# Patient Record
Sex: Female | Born: 1980 | Race: Black or African American | Hispanic: No | Marital: Single | Smoking: Never smoker
Health system: Southern US, Community
[De-identification: ages and names within clinical notes are randomized; demographics above are authoritative.]

## PROBLEM LIST (undated history)

## (undated) DIAGNOSIS — K219 Gastro-esophageal reflux disease without esophagitis: Secondary | ICD-10-CM

## (undated) DIAGNOSIS — B958 Unspecified staphylococcus as the cause of diseases classified elsewhere: Secondary | ICD-10-CM

## (undated) DIAGNOSIS — E041 Nontoxic single thyroid nodule: Secondary | ICD-10-CM

## (undated) DIAGNOSIS — R05 Cough: Secondary | ICD-10-CM

## (undated) DIAGNOSIS — G43909 Migraine, unspecified, not intractable, without status migrainosus: Secondary | ICD-10-CM

## (undated) DIAGNOSIS — R1310 Dysphagia, unspecified: Secondary | ICD-10-CM

## (undated) DIAGNOSIS — M797 Fibromyalgia: Secondary | ICD-10-CM

## (undated) DIAGNOSIS — R059 Cough, unspecified: Secondary | ICD-10-CM

## (undated) HISTORY — DX: Migraine, unspecified, not intractable, without status migrainosus: G43.909

## (undated) HISTORY — DX: Cough, unspecified: R05.9

## (undated) HISTORY — DX: Dysphagia, unspecified: R13.10

## (undated) HISTORY — PX: OTHER SURGICAL HISTORY: SHX169

## (undated) HISTORY — DX: Cough: R05

## (undated) HISTORY — DX: Gastro-esophageal reflux disease without esophagitis: K21.9

---
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
## 1999-01-04 ENCOUNTER — Inpatient Hospital Stay (HOSPITAL_COMMUNITY): Admission: AD | Admit: 1999-01-04 | Discharge: 1999-01-04 | Payer: Self-pay | Admitting: *Deleted

## 1999-03-29 ENCOUNTER — Inpatient Hospital Stay (HOSPITAL_COMMUNITY): Admission: AD | Admit: 1999-03-29 | Discharge: 1999-03-29 | Payer: Self-pay | Admitting: *Deleted

## 1999-05-23 ENCOUNTER — Encounter: Payer: Self-pay | Admitting: Emergency Medicine

## 1999-05-23 ENCOUNTER — Emergency Department (HOSPITAL_COMMUNITY): Admission: EM | Admit: 1999-05-23 | Discharge: 1999-05-23 | Payer: Self-pay | Admitting: Emergency Medicine

## 1999-05-29 ENCOUNTER — Emergency Department (HOSPITAL_COMMUNITY): Admission: EM | Admit: 1999-05-29 | Discharge: 1999-05-29 | Payer: Self-pay | Admitting: *Deleted

## 1999-06-21 ENCOUNTER — Inpatient Hospital Stay (HOSPITAL_COMMUNITY): Admission: AD | Admit: 1999-06-21 | Discharge: 1999-06-21 | Payer: Self-pay | Admitting: *Deleted

## 1999-08-22 ENCOUNTER — Emergency Department (HOSPITAL_COMMUNITY): Admission: EM | Admit: 1999-08-22 | Discharge: 1999-08-22 | Payer: Self-pay | Admitting: *Deleted

## 1999-09-13 ENCOUNTER — Inpatient Hospital Stay (HOSPITAL_COMMUNITY): Admission: AD | Admit: 1999-09-13 | Discharge: 1999-09-13 | Payer: Self-pay | Admitting: *Deleted

## 1999-12-06 ENCOUNTER — Inpatient Hospital Stay (HOSPITAL_COMMUNITY): Admission: AD | Admit: 1999-12-06 | Discharge: 1999-12-06 | Payer: Self-pay | Admitting: *Deleted

## 2000-02-29 ENCOUNTER — Inpatient Hospital Stay (HOSPITAL_COMMUNITY): Admission: AD | Admit: 2000-02-29 | Discharge: 2000-02-29 | Payer: Self-pay | Admitting: *Deleted

## 2000-05-30 ENCOUNTER — Inpatient Hospital Stay (HOSPITAL_COMMUNITY): Admission: AD | Admit: 2000-05-30 | Discharge: 2000-05-30 | Payer: Self-pay | Admitting: *Deleted

## 2000-08-27 ENCOUNTER — Inpatient Hospital Stay (HOSPITAL_COMMUNITY): Admission: AD | Admit: 2000-08-27 | Discharge: 2000-08-27 | Payer: Self-pay | Admitting: *Deleted

## 2000-09-30 ENCOUNTER — Emergency Department (HOSPITAL_COMMUNITY): Admission: EM | Admit: 2000-09-30 | Discharge: 2000-09-30 | Payer: Self-pay | Admitting: Emergency Medicine

## 2000-11-28 ENCOUNTER — Inpatient Hospital Stay (HOSPITAL_COMMUNITY): Admission: AD | Admit: 2000-11-28 | Discharge: 2000-11-28 | Payer: Self-pay | Admitting: *Deleted

## 2001-02-20 ENCOUNTER — Other Ambulatory Visit: Admission: RE | Admit: 2001-02-20 | Discharge: 2001-02-20 | Payer: Self-pay | Admitting: *Deleted

## 2001-02-20 ENCOUNTER — Inpatient Hospital Stay (HOSPITAL_COMMUNITY): Admission: AD | Admit: 2001-02-20 | Discharge: 2001-02-20 | Payer: Self-pay | Admitting: *Deleted

## 2001-05-05 ENCOUNTER — Inpatient Hospital Stay (HOSPITAL_COMMUNITY): Admission: AD | Admit: 2001-05-05 | Discharge: 2001-05-05 | Payer: Self-pay | Admitting: *Deleted

## 2001-10-04 ENCOUNTER — Emergency Department (HOSPITAL_COMMUNITY): Admission: EM | Admit: 2001-10-04 | Discharge: 2001-10-05 | Payer: Self-pay

## 2001-11-26 ENCOUNTER — Encounter: Payer: Self-pay | Admitting: Gastroenterology

## 2001-11-26 ENCOUNTER — Ambulatory Visit (HOSPITAL_COMMUNITY): Admission: RE | Admit: 2001-11-26 | Discharge: 2001-11-26 | Payer: Self-pay | Admitting: Gastroenterology

## 2001-11-27 ENCOUNTER — Ambulatory Visit (HOSPITAL_COMMUNITY): Admission: RE | Admit: 2001-11-27 | Discharge: 2001-11-27 | Payer: Self-pay | Admitting: Gastroenterology

## 2001-12-02 ENCOUNTER — Encounter: Payer: Self-pay | Admitting: Gastroenterology

## 2001-12-02 ENCOUNTER — Ambulatory Visit (HOSPITAL_COMMUNITY): Admission: RE | Admit: 2001-12-02 | Discharge: 2001-12-02 | Payer: Self-pay | Admitting: Gastroenterology

## 2002-11-20 ENCOUNTER — Other Ambulatory Visit: Admission: RE | Admit: 2002-11-20 | Discharge: 2002-11-20 | Payer: Self-pay | Admitting: Family Medicine

## 2003-05-27 ENCOUNTER — Inpatient Hospital Stay (HOSPITAL_COMMUNITY): Admission: EM | Admit: 2003-05-27 | Discharge: 2003-05-30 | Payer: Self-pay | Admitting: Psychiatry

## 2003-10-09 HISTORY — PX: OTHER SURGICAL HISTORY: SHX169

## 2004-07-24 ENCOUNTER — Ambulatory Visit (HOSPITAL_BASED_OUTPATIENT_CLINIC_OR_DEPARTMENT_OTHER): Admission: RE | Admit: 2004-07-24 | Discharge: 2004-07-24 | Payer: Self-pay | Admitting: Specialist

## 2004-07-24 ENCOUNTER — Ambulatory Visit (HOSPITAL_COMMUNITY): Admission: RE | Admit: 2004-07-24 | Discharge: 2004-07-24 | Payer: Self-pay | Admitting: Specialist

## 2004-07-24 ENCOUNTER — Encounter (INDEPENDENT_AMBULATORY_CARE_PROVIDER_SITE_OTHER): Payer: Self-pay | Admitting: *Deleted

## 2004-09-29 ENCOUNTER — Emergency Department (HOSPITAL_COMMUNITY): Admission: EM | Admit: 2004-09-29 | Discharge: 2004-09-29 | Payer: Self-pay

## 2005-10-02 ENCOUNTER — Inpatient Hospital Stay (HOSPITAL_COMMUNITY): Admission: AD | Admit: 2005-10-02 | Discharge: 2005-10-02 | Payer: Self-pay | Admitting: Obstetrics

## 2005-11-13 ENCOUNTER — Emergency Department (HOSPITAL_COMMUNITY): Admission: EM | Admit: 2005-11-13 | Discharge: 2005-11-13 | Payer: Self-pay | Admitting: Emergency Medicine

## 2007-07-10 ENCOUNTER — Encounter: Admission: RE | Admit: 2007-07-10 | Discharge: 2007-08-25 | Payer: Self-pay | Admitting: Chiropractic Medicine

## 2007-10-13 ENCOUNTER — Emergency Department (HOSPITAL_COMMUNITY): Admission: EM | Admit: 2007-10-13 | Discharge: 2007-10-13 | Payer: Self-pay | Admitting: Family Medicine

## 2007-10-16 ENCOUNTER — Encounter: Admission: RE | Admit: 2007-10-16 | Discharge: 2007-10-16 | Payer: Self-pay | Admitting: Internal Medicine

## 2007-12-05 ENCOUNTER — Other Ambulatory Visit: Admission: RE | Admit: 2007-12-05 | Discharge: 2007-12-05 | Payer: Self-pay | Admitting: Interventional Radiology

## 2007-12-05 ENCOUNTER — Encounter: Admission: RE | Admit: 2007-12-05 | Discharge: 2007-12-05 | Payer: Self-pay | Admitting: General Surgery

## 2007-12-05 ENCOUNTER — Encounter (INDEPENDENT_AMBULATORY_CARE_PROVIDER_SITE_OTHER): Payer: Self-pay | Admitting: Interventional Radiology

## 2008-04-16 ENCOUNTER — Encounter: Admission: RE | Admit: 2008-04-16 | Discharge: 2008-04-16 | Payer: Self-pay | Admitting: Gastroenterology

## 2008-07-17 ENCOUNTER — Emergency Department (HOSPITAL_COMMUNITY): Admission: EM | Admit: 2008-07-17 | Discharge: 2008-07-17 | Payer: Self-pay | Admitting: Emergency Medicine

## 2008-09-07 ENCOUNTER — Emergency Department (HOSPITAL_COMMUNITY): Admission: EM | Admit: 2008-09-07 | Discharge: 2008-09-07 | Payer: Self-pay | Admitting: Emergency Medicine

## 2008-10-08 HISTORY — PX: OTHER SURGICAL HISTORY: SHX169

## 2009-02-09 ENCOUNTER — Inpatient Hospital Stay (HOSPITAL_COMMUNITY): Admission: AD | Admit: 2009-02-09 | Discharge: 2009-02-09 | Payer: Self-pay | Admitting: Obstetrics

## 2009-02-09 ENCOUNTER — Ambulatory Visit: Payer: Self-pay | Admitting: Physician Assistant

## 2009-12-31 IMAGING — US US SOFT TISSUE HEAD/NECK
1 series · 14 of 25 positions shown · non-contrast
Comparison: none

CLINICAL DATA: Thyroid nodule felt on physical exam.  
 ULTRASOUND SOFT TISSUE HEAD/NECK:
TECHNIQUE: Ultrasound examination of the soft tissues was performed in the area of clinical concern.

[Series 1: us soft tissue head/neck · 0.13mm/px · 14 of 47 slices shown]
[im 1/47]
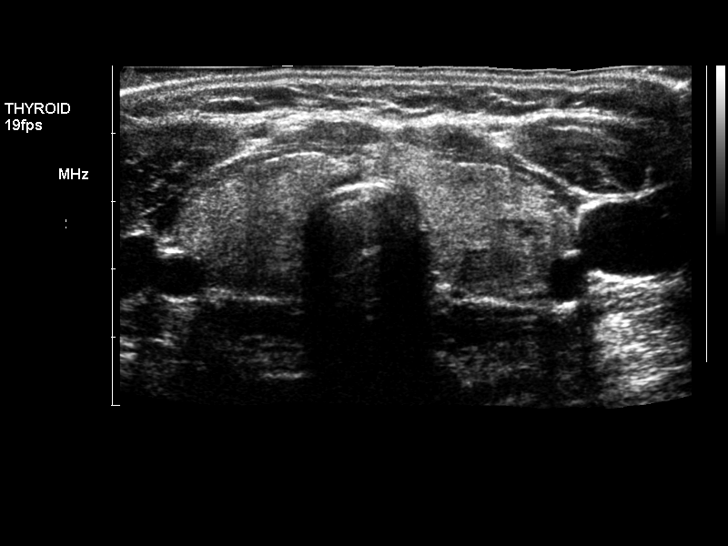
[im 4/47]
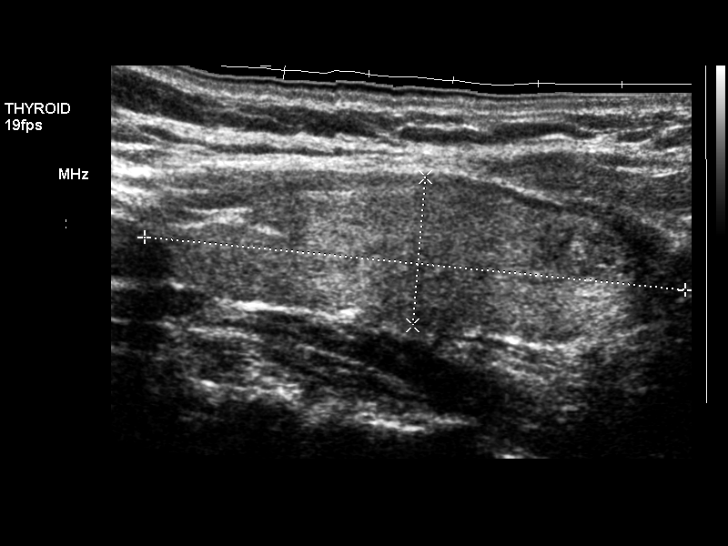
[im 8/47]
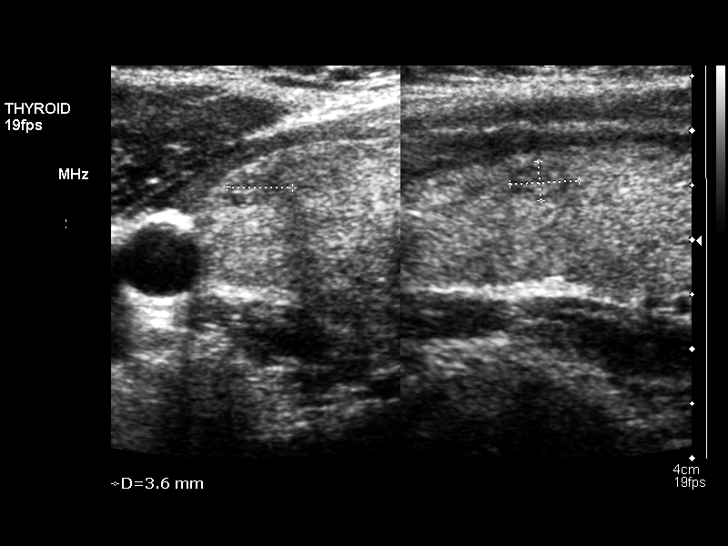
[im 12/47]
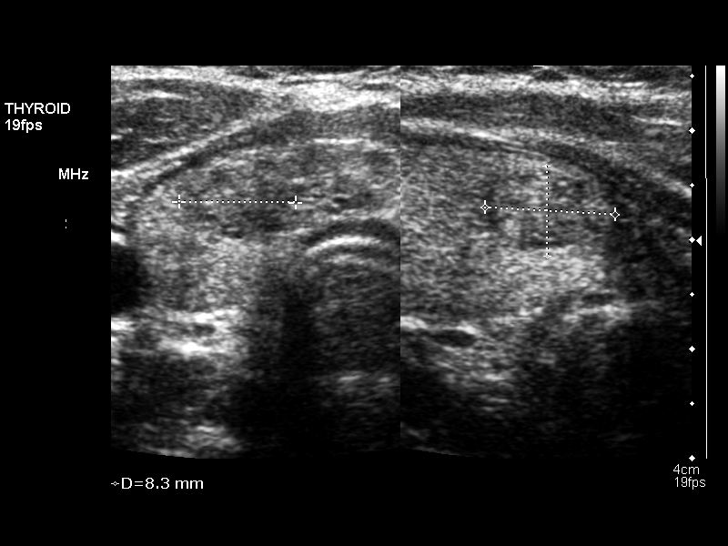
[im 16/47]
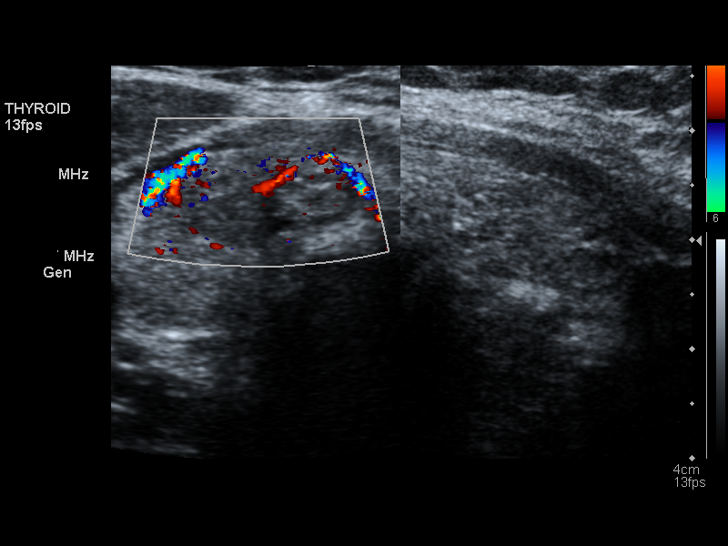
[im 18/47]
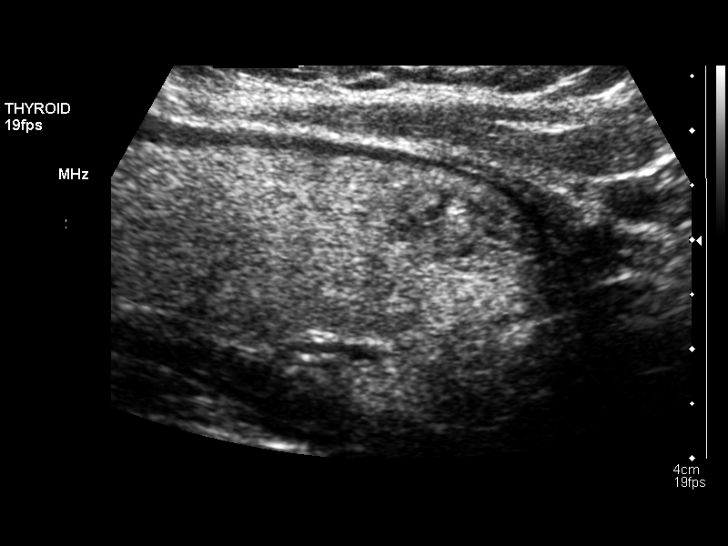
[im 22/47]
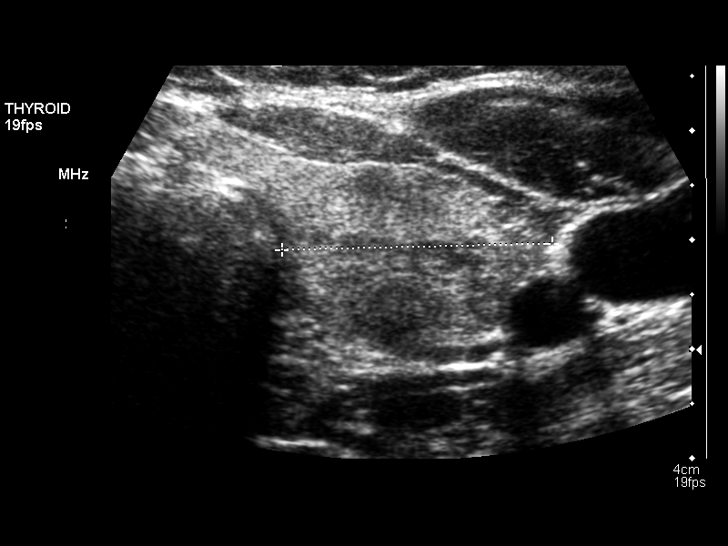
[im 25/47]
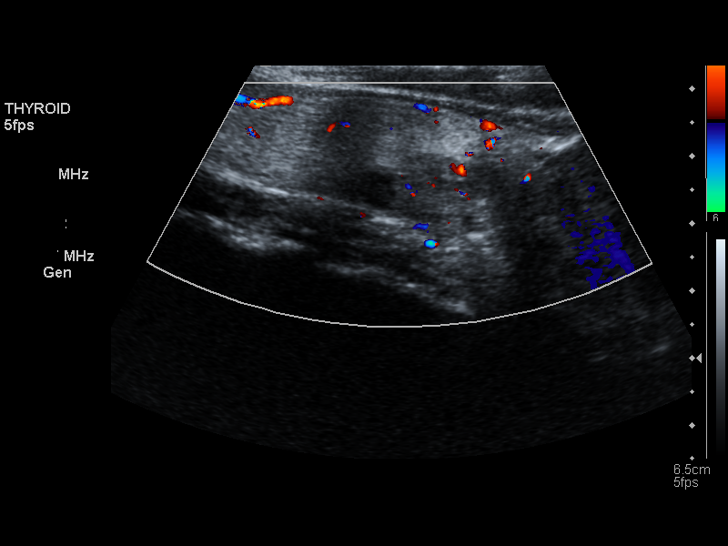
[im 29/47]
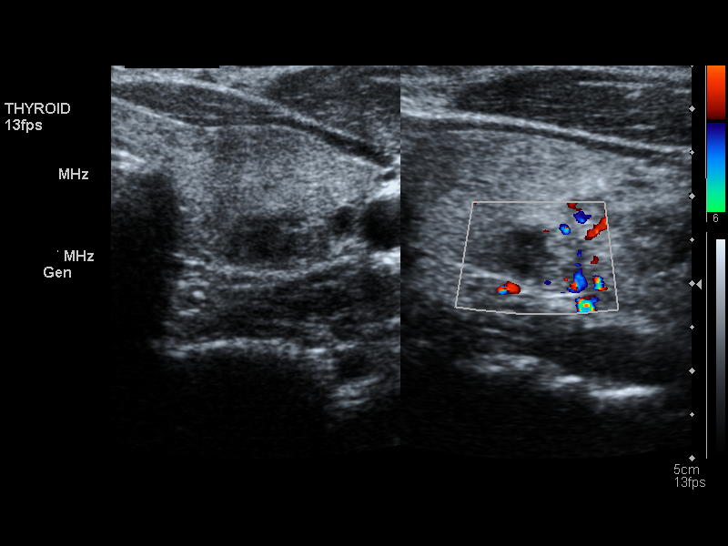
[im 31/47]
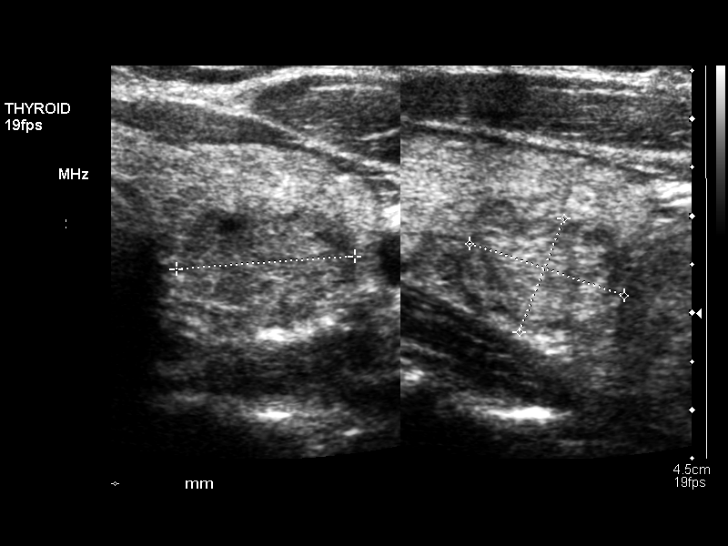
[im 35/47]
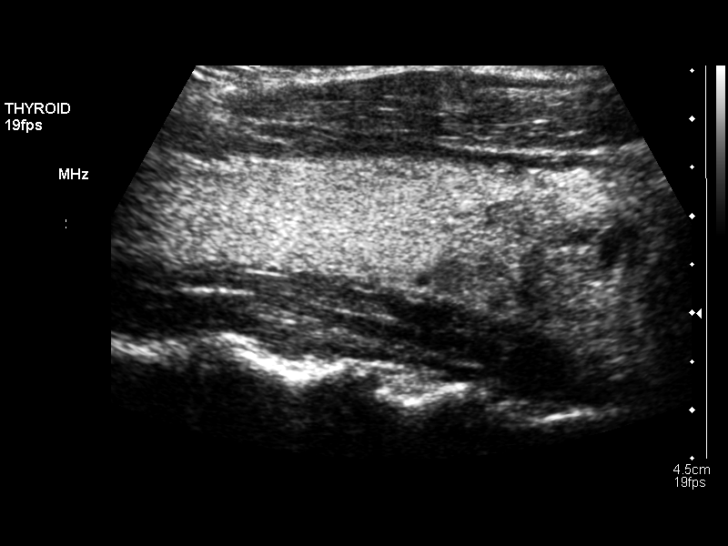
[im 39/47]
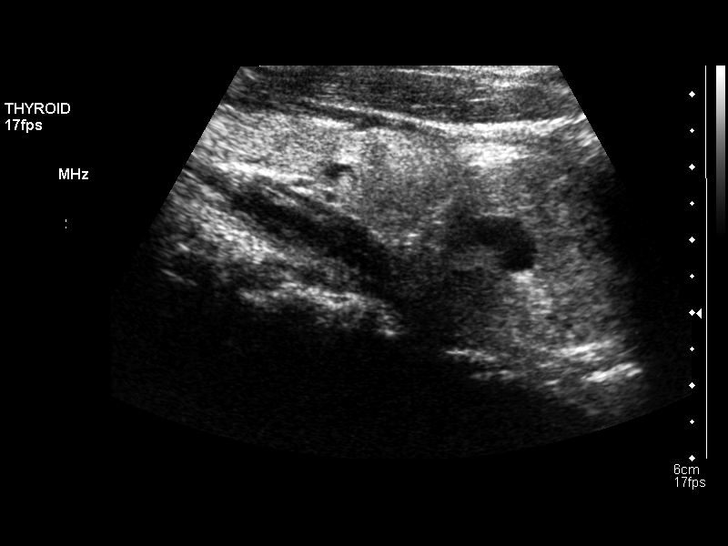
[im 43/47]
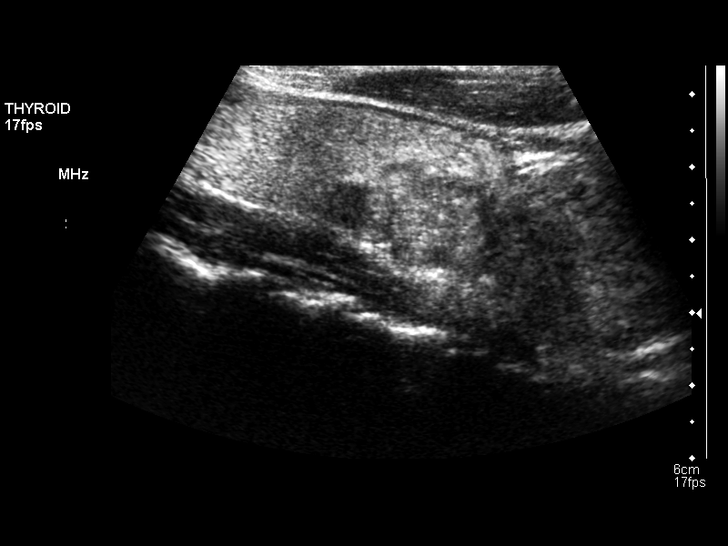
[im 47/47]
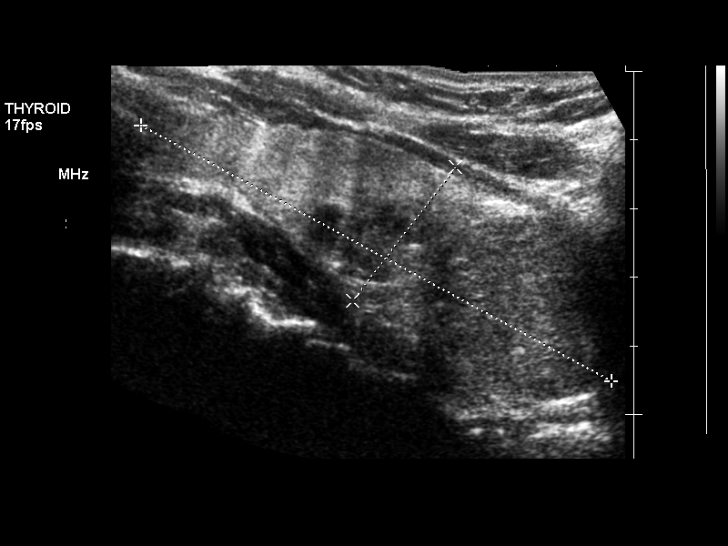

[14 of 25 positions shown; findings below may reference images not displayed]

FINDINGS: The thyroid gland is diffusely prominent.  The right lobe measures 6.4cm sagittally with a depth of 1.8cm and width of 2.0cm.  The left lobe measures 7.8 x 2.8 x 2.5cm with the isthmus thickened at 8.7mm.  The gland is somewhat inhomogeneous and there are solid nodules bilaterally.  The dominant nodule is in the lower pole on the left of 2.8 x 3.0 x 3.0cm.  Biopsy is recommended.  There is also a nodule in the lower pole of the left of 1.8 x 1.7 x 1.3cm and biopsy is recommended as well.  There is a nodule in the medial right lobe of 1.5 x 1.9 x 1.0cm.  Several smaller nodules are present as well.
IMPRESSION: Multiple solid thyroid nodules with two dominant nodules in the lower pole on the left.  Recommend biopsy.

## 2010-02-27 ENCOUNTER — Ambulatory Visit (HOSPITAL_COMMUNITY): Admission: RE | Admit: 2010-02-27 | Discharge: 2010-02-27 | Payer: Self-pay | Admitting: Obstetrics

## 2010-02-28 ENCOUNTER — Emergency Department (HOSPITAL_COMMUNITY): Admission: EM | Admit: 2010-02-28 | Discharge: 2010-02-28 | Payer: Self-pay | Admitting: Emergency Medicine

## 2010-07-10 ENCOUNTER — Ambulatory Visit: Payer: Self-pay | Admitting: Nurse Practitioner

## 2010-07-10 ENCOUNTER — Inpatient Hospital Stay (HOSPITAL_COMMUNITY): Admission: AD | Admit: 2010-07-10 | Discharge: 2010-07-10 | Payer: Self-pay | Admitting: Obstetrics & Gynecology

## 2010-08-01 ENCOUNTER — Emergency Department (HOSPITAL_COMMUNITY): Admission: EM | Admit: 2010-08-01 | Discharge: 2010-08-01 | Payer: Self-pay | Admitting: Emergency Medicine

## 2010-08-08 ENCOUNTER — Emergency Department (HOSPITAL_COMMUNITY): Admission: EM | Admit: 2010-08-08 | Discharge: 2010-08-08 | Payer: Self-pay | Admitting: Emergency Medicine

## 2010-08-10 ENCOUNTER — Emergency Department (HOSPITAL_COMMUNITY): Admission: EM | Admit: 2010-08-10 | Discharge: 2010-08-10 | Payer: Self-pay | Admitting: Emergency Medicine

## 2010-12-21 LAB — URINE MICROSCOPIC-ADD ON

## 2010-12-21 LAB — URINALYSIS, ROUTINE W REFLEX MICROSCOPIC
Bilirubin Urine: NEGATIVE
Glucose, UA: NEGATIVE mg/dL
Nitrite: NEGATIVE
Specific Gravity, Urine: >1.03 — ABNORMAL HIGH (ref 1.005–1.030)
pH: 6 (ref 5.0–8.0)

## 2010-12-21 LAB — WET PREP, GENITAL

## 2011-02-23 NOTE — H&P (Signed)
NAME:  Meredith Reeves, Meredith Reeves                        ACCOUNT NO.:  [id number]   MEDICAL RECORD NO.:  [id number]                   PATIENT TYPE:  IPS   LOCATION:  0301                                 [facility]ILITY:  [facility]   PHYSICIAN:  Jeanice Lim, M.D.              DATE OF BIRTH:  [birth year]   DATE OF ADMISSION:  05/27/2003  DATE OF DISCHARGE:                         PSYCHIATRIC ADMISSION ASSESSMENT   IDENTIFYING INFORMATION:  This is a 30 year old single African-American  female voluntarily admitted on May 27, 2003.   HISTORY OF PRESENT ILLNESS:  The patient presents with a history of  depression, feeling overwhelmed with everything, although she did not  specify any of what the significant stressors were.  She has been having  suicidal thoughts, denied any specific plan.  The patient recently started  on antidepressant medication.  She has been sleeping and her appetite has  been satisfactory.  She denies any psychotic symptoms.  She denies any  current suicidal ideation.  Chart does indicate that patient was having  positive auditory hallucinations prior.   PAST PSYCHIATRIC HISTORY:  First hospitalization to [facility].  No outpatient treatment.  No suicide attempts.   SOCIAL HISTORY:  This is a 30 year old single African-American female.  Has  an 28-year-old child that is currently with the patient's mother.  The  patient normally lives with her son.  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  She has completed high school.   FAMILY HISTORY:  Unclear.   ALCOHOL/DRUG HISTORY:  Nonsmoker.  Denies any alcohol or drug use.   PRIMARY CARE PHYSICIAN:  Dr. Daphine Deutscher on [address]   MEDICAL PROBLEMS:  None.   MEDICATIONS:  The patient recently started on ______ 6\25 mg two days ago.   ALLERGIES:  No known allergies.   REVIEW OF SYSTEMS:  No cardiac or pulmonary problems, hematological,  endocrine.  Has a hiatal hernia.  Sexually active.  Is on  Depo-Provera  injection.  Some arthritis.  Has three tattoos.   PHYSICAL EXAMINATION:  VITAL SIGNS:  Temperature 99.2, heart rate 83,  respirations 16, blood pressure 120/79.  The patient is 5 feet 2 inches  tall.  She is 154 pounds.  GENERAL:  This is an overweight female, lying in the bed, in no acute  distress.  HEAD:  Normocephalic and atraumatic.  NECK:  Negative lymphadenopathy.  CHEST:  Clear to auscultation.  HEART:  Regular rate and rhythm.  ABDOMEN:  Soft, nontender.  MUSCULOSKELETAL:  Muscle strength and tone is equal bilaterally.  No CVA  tenderness.   LABORATORY DATA:  CBC shows an MCV of 71.8.  CMET within normal limits.  Awaiting a urine, urine drug screen and urine pregnancy test.   MENTAL STATUS EXAM:  She is in bed, reluctant to talk but provided some one-  word answers.  Very evasive.  Speech is clear.  Mood is depressed,  overwhelmed.  Mood with some mild irritability.  Thought processes with no  current hallucinations.  Denies any current suicidal or homicidal thoughts.  Cognitive function intact.  Memory is fair.  Judgment and insight are fair.  Poor historian.   DIAGNOSES:   AXIS I:  Major depressive disorder, recurrent.   AXIS II:  Deferred.   AXIS III:  None.   AXIS IV:  Other psychosocial problems.   AXIS V:  Current 35; past year 19.   PLAN:  Voluntary admission for depression and suicidal ideation.  Contract  for safety.  Check every 15 minutes.  Stabilize mood and thinking so patient  can be safe.  Will obtain further labs as needed.  Will initiate an  antidepressant, decreased depressive symptoms.  The patient is to follow up  with mental health.   TENTATIVE LENGTH OF STAY:  Three to five days.     Landry Corporal, N.P.                       Jeanice Lim, M.D.    JO/MEDQ  D:  05/28/2003  T:  05/29/2003  Job:  [id number]

## 2011-02-23 NOTE — Procedures (Signed)
[facility]  Patient:    Meredith Reeves, Meredith Reeves Visit Number: [id number] MRN: [id number]          Service Type: EMS Location: ED Attending Physician:  Armanda Heritage Dictated by:   Anselmo Rod, M.D. Proc. Date: 11/27/01 Admit Date:  10/04/2001 Discharge Date: 10/05/2001   CC:         Geraldo Pitter, M.D.   Procedure Report  DATE OF BIRTH:  [birth year]  REFERRING PHYSICIAN:  Geraldo Pitter, M.D.  PROCEDURE PERFORMED:  Esophagogastroduodenoscopy.  ENDOSCOPIST:  Anselmo Rod, M.D.  INSTRUMENT USED:  Olympus video panendoscope.  INDICATIONS FOR PROCEDURE:  Epigastric pain with normal abdominal ultrasound in a 30 year old African-American female rule out peptic ulcer disease, esophagitis, gastritis, etc.  PREPROCEDURE PREPARATION:  Informed consent was procured from the patient. The patient was fasted for eight hours prior to the procedure.  PREPROCEDURE PHYSICAL:  The patient had stable vital signs.  Neck supple. Chest clear to auscultation.  S1, S2 regular.  Abdomen soft with normal abdominal bowel sounds.  DESCRIPTION OF PROCEDURE:  The patient was placed in left lateral decubitus position and sedated with 70 mg of Demerol and 7 mg of Versed intravenously. Once the patient was adequately sedated and maintained on low-flow oxygen and continuous cardiac monitoring, the Olympus video panendoscope was advanced through the mouthpiece, over the tongue, into the esophagus under direct vision.  The entire esophagus appeared normal, without lesions.  Advancing the scope into the stomach, a large hiatal hernia was seen on high retroflexion. The rest of the gastric mucosa and the proximal small bowel appeared normal without evidence of erosions or ulcerations.  IMPRESSION:  Normal esophagogastroduodenoscopy except for large hiatal hernia.  RECOMMENDATION: 1. Continue PPIs for now. 2. Follow-up with HIDA scan results, scheduled for next  week. 3. Avoid all nonsteroidals. 4. Follow antireflux measures. 5. Outpatient follow-up in the next seven to 10 days. Dictated by:   Anselmo Rod, M.D. Attending Physician:  Armanda Heritage DD:  11/27/01 TD:  11/28/01 Job: [id number]

## 2011-02-23 NOTE — Discharge Summary (Signed)
NAME:  Meredith Reeves, Meredith Reeves NO.:  [id number]   MEDICAL RECORD NO.:  [id number]                   PATIENT TYPE:  IPS   LOCATION:  0301                                 [facility]ACILITY:  [facility]   PHYSICIAN:  Jeanice Lim, M.D.              DATE OF BIRTH:  02/04/1981   DATE OF ADMISSION:  05/27/2003  DATE OF DISCHARGE:  05/30/2003                                 DISCHARGE SUMMARY   IDENTIFYING DATA:  This is a 30 year old African American female voluntarily  admitted with a history of depression, feeling overwhelmed, having suicidal  thoughts, have been recently started on antidepressants due to depressive  symptoms, and patient had reported auditory hallucinations prior to  admission.   MEDICATIONS:  Recently started on Symbyax.   ALLERGIES:  No known drug allergies.   PHYSICAL EXAMINATION:  Within normal limits.  NEUROLOGIC:  Nonfocal.  Patient is on Depo-Provera injection, has a history  of arthritis and 3 type 2s.  MENTAL STATUS EXAM:  Patient was in bed, reluctant talk initially, somewhat  evasive, speech was clear, mood depressed, feeling overwhelmed, mild  irritability, thought processes mostly goal directed, denied current  hallucinations and acute suicidal/homicidal ideations, cognitively intact,  judgment and insight fair to poor, poor historian.   ROUTINE ADMISSION LABORATORIES:  Essentially within normal limits including  CBC and CMET.   ADMITTING DIAGNOSES:   AXIS I:  Major depressive disorder, recurrent.   AXIS II:  Deferred.   AXIS III:  None.   AXIS IV:  Other psychosocial problems.   AXIS V:  Global assessment of functioning 35/65.   Patient was admitted and ordered routine p.r.n. medications, underwent  further monitoring, was encouraged to participate in individual, group, and  milieu therapy.  Patient was monitored for safety and started on Lexapro  targeting depressive symptoms.  Family meeting was requested and Lexapro  optimized, Seroquel was started restore sleep and to augment antidepressant.  Patient reported positive response to medication changes and clinical  intervention.  Condition at discharge markedly improved.  Mood is more  euthymic, affect brighter, thought process was goal directed, thought  content negative for dangerous ideations or psychotic symptoms.  Patient  reported motivation and being compliant with the aftercare plan.  Risk/benefit ratio and side effects were discussed with patient who was  comfortable with medication regimen.   DISCHARGE MEDICATIONS:  1. Lexapro 10 mg q.a.m.  2. Seroquel 100 mg q.h.s.  3. Ambien 10 mg q.h.s. p.r.n. insomnia.   FOLLOWUP:  Patient is to followup for medication monitoring and therapy, as  well as with a primary care physician regarding labs.   DISCHARGE DIAGNOSES:   AXIS I:  Major depressive disorder, recurrent.   AXIS II:  Deferred.   AXIS III:  None.   AXIS IV:  Other psychosocial problems.   AXIS V:  Global assessment of functioning 55.  Jeanice Lim, M.D.    JEM/MEDQ  D:  06/24/2003  T:  06/26/2003  Job:  [id number]

## 2011-02-23 NOTE — Op Note (Signed)
[name], ZIEHM NO.:  [id number]   MEDICAL RECORD NO.:  [id number]          PATIENT TYPE:  AMB   LOCATION:  DSC                          FACILITY:  [facility]   PHYSICIAN:  Earvin Hansen L. Shon Hough, M.D.DATE OF BIRTH:  Feb 29, 1981   DATE OF PROCEDURE:  07/24/2004  DATE OF DISCHARGE:                                 OPERATIVE REPORT   This is a 30 year old lady with severe macromastia, back and shoulder pain  secondary to large pendulous breast, increase intertriginous changes under  the breast, pitting at the shoulders, etc. All these have been negative, not  beneficial with conservative treatment. All the procedure in detail as well  as intended risks and possible complications have been explained to this  patient preoperatively. The patient understands and consents to procedure.   PROCEDURE DONE:  Bilateral breast reduction using T-inferior pedicle  technique.   ANESTHESIA:  General.   Preoperatively the patient was set up and drawn for bilateral breast  reduction using the inferior pedicle technique, re-marking the nipple-  areolar complexes back up to 21-cm from the suprasternal notch. She then  underwent general anesthesia and intubated orally. Prep was done to the  chest/breast areas in routine fashion using Betadine soap and solution  walled off with sterile towels and drapes so as to make a sterile field.  After this, 1/4% Xylocaine with epinephrine 1:400,000 concentration was  injected locally, a total of 150 cc per side. The wounds were then scored  with #15 blade. The skin over the inferior pedicle was de-epithelialized  with #20 blade. Medial and lateral fatty dermal pedicles were excised down  to underlying fascia. The new keyhole area was also debulked and out  laterally more breast tissue was taken and accessory breast tissue. After  proper hemostasis and irrigation, the flaps were then transposed and stayed  with 3-0 Prolene suture. Subcutaneous  closure was done with 3-0 Monocryl  times two layers, then a running subcuticular suture of 3-0 Monocryl and 5-0  Monocryl throughout the inverted T. The wounds were drained with #10 Blake  drains which were placed in the depths of the wound and brought out through  the lateral most of the incisions, secured with 3-0 Prolene. Steri-Strips  and soft dressings were applied including Xerofoam, 4x4's, ABDs, and  hypofixed tape. She was then taken to the recovery room in excellent  condition.      Maree Erie   GLT/MEDQ  D:  07/24/2004  T:  07/24/2004  Job:  [id number]

## 2011-03-11 ENCOUNTER — Emergency Department (HOSPITAL_COMMUNITY)
Admission: EM | Admit: 2011-03-11 | Discharge: 2011-03-12 | Disposition: A | Discharge status: Home / Self Care | Payer: Self-pay | Attending: Emergency Medicine | Admitting: Emergency Medicine

## 2011-03-11 DIAGNOSIS — J029 Acute pharyngitis, unspecified: Secondary | ICD-10-CM | POA: Insufficient documentation

## 2011-03-11 DIAGNOSIS — IMO0001 Reserved for inherently not codable concepts without codable children: Secondary | ICD-10-CM | POA: Insufficient documentation

## 2011-03-11 DIAGNOSIS — R51 Headache: Secondary | ICD-10-CM | POA: Insufficient documentation

## 2011-03-11 DIAGNOSIS — R509 Fever, unspecified: Secondary | ICD-10-CM | POA: Insufficient documentation

## 2011-03-12 LAB — RAPID STREP SCREEN (MED CTR MEBANE ONLY): Streptococcus, Group A Screen (Direct): NEGATIVE

## 2011-03-16 ENCOUNTER — Emergency Department (HOSPITAL_COMMUNITY)
Admission: EM | Admit: 2011-03-16 | Discharge: 2011-03-16 | Disposition: A | Discharge status: Home / Self Care | Payer: Self-pay | Attending: Emergency Medicine | Admitting: Emergency Medicine

## 2011-03-16 DIAGNOSIS — R110 Nausea: Secondary | ICD-10-CM | POA: Insufficient documentation

## 2011-03-16 DIAGNOSIS — R300 Dysuria: Secondary | ICD-10-CM | POA: Insufficient documentation

## 2011-03-16 DIAGNOSIS — R5381 Other malaise: Secondary | ICD-10-CM | POA: Insufficient documentation

## 2011-03-16 DIAGNOSIS — R5383 Other fatigue: Secondary | ICD-10-CM | POA: Insufficient documentation

## 2011-03-16 DIAGNOSIS — IMO0001 Reserved for inherently not codable concepts without codable children: Secondary | ICD-10-CM | POA: Insufficient documentation

## 2011-03-16 DIAGNOSIS — R509 Fever, unspecified: Secondary | ICD-10-CM | POA: Insufficient documentation

## 2011-03-16 DIAGNOSIS — R630 Anorexia: Secondary | ICD-10-CM | POA: Insufficient documentation

## 2011-03-16 DIAGNOSIS — R070 Pain in throat: Secondary | ICD-10-CM | POA: Insufficient documentation

## 2011-03-16 LAB — URINALYSIS, ROUTINE W REFLEX MICROSCOPIC
Ketones, ur: NEGATIVE mg/dL
Nitrite: NEGATIVE
Specific Gravity, Urine: 1.016 (ref 1.005–1.030)
pH: 7.5 (ref 5.0–8.0)

## 2011-03-16 LAB — URINE MICROSCOPIC-ADD ON

## 2011-03-16 LAB — CBC
MCH: 22.9 pg — ABNORMAL LOW (ref 26.0–34.0)
Platelets: 256 10*3/uL (ref 150–400)
RBC: 5.42 MIL/uL — ABNORMAL HIGH (ref 3.87–5.11)
WBC: 4.9 10*3/uL (ref 4.0–10.5)

## 2011-03-16 LAB — DIFFERENTIAL
Basophils Absolute: 0 10*3/uL (ref 0.0–0.1)
Basophils Relative: 0 % (ref 0–1)
Eosinophils Absolute: 0 10*3/uL (ref 0.0–0.7)
Neutro Abs: 1.3 10*3/uL — ABNORMAL LOW (ref 1.7–7.7)
Neutrophils Relative %: 27 % — ABNORMAL LOW (ref 43–77)

## 2011-03-16 LAB — BASIC METABOLIC PANEL
BUN: 7 mg/dL (ref 6–23)
GFR calc non Af Amer: >60 mL/min (ref >60)
Glucose, Bld: 98 mg/dL (ref 70–99)
Potassium: 4 mEq/L (ref 3.5–5.1)

## 2011-05-10 ENCOUNTER — Other Ambulatory Visit: Payer: Self-pay | Admitting: Internal Medicine

## 2011-05-10 DIAGNOSIS — E042 Nontoxic multinodular goiter: Secondary | ICD-10-CM

## 2011-05-16 ENCOUNTER — Ambulatory Visit (HOSPITAL_COMMUNITY)
Admission: RE | Admit: 2011-05-16 | Discharge: 2011-05-16 | Disposition: A | Discharge status: Home / Self Care | Payer: Medicaid Other | Source: Ambulatory Visit | Attending: Internal Medicine | Admitting: Internal Medicine

## 2011-05-16 DIAGNOSIS — E042 Nontoxic multinodular goiter: Secondary | ICD-10-CM | POA: Insufficient documentation

## 2011-05-30 ENCOUNTER — Encounter (HOSPITAL_BASED_OUTPATIENT_CLINIC_OR_DEPARTMENT_OTHER): Payer: Self-pay

## 2011-07-02 ENCOUNTER — Ambulatory Visit (INDEPENDENT_AMBULATORY_CARE_PROVIDER_SITE_OTHER): Payer: PRIVATE HEALTH INSURANCE | Admitting: General Surgery

## 2011-07-02 ENCOUNTER — Other Ambulatory Visit (INDEPENDENT_AMBULATORY_CARE_PROVIDER_SITE_OTHER): Payer: Self-pay | Admitting: General Surgery

## 2011-07-02 ENCOUNTER — Encounter (INDEPENDENT_AMBULATORY_CARE_PROVIDER_SITE_OTHER): Payer: Self-pay | Admitting: General Surgery

## 2011-07-02 VITALS — BP 134/90 | HR 68 | Temp 97.0°F | Resp 16 | Ht 62.0 in | Wt 191.2 lb

## 2011-07-02 DIAGNOSIS — E049 Nontoxic goiter, unspecified: Secondary | ICD-10-CM

## 2011-07-02 DIAGNOSIS — E041 Nontoxic single thyroid nodule: Secondary | ICD-10-CM

## 2011-07-02 NOTE — Progress Notes (Signed)
Chief Complaint  Patient presents with  . Other    Thyroid nodule enlarged    HPI Meredith Reeves is a 30 y.o. female.   HPIShe is referred here by Dr. Margaretmary Bayley of endocrinology for evaluation of multiple thyroid nodules with a right-sided dominant nodule. These nodules are enlarging especially the right sided dominant nodule. She states her thyroid function tests are normal. She has no hypothyroid or hyperthyroid type symptoms. Dr. Kemper Durie is recommended she get a needle biopsy of his right dominant nodule. She states she's had that before. She does have some mild compressive symptoms that she intermittently has some cervical dysphagia. Her mother had a goiter and had her thyroid gland removed. No exposure to ionizing radiation.  Past Medical History  Diagnosis Date  . GERD (gastroesophageal reflux disease)   . Migraines   . Cough   . Trouble swallowing     Past Surgical History  Procedure Date  . Cesarean section 1996  . Liposection 2010  . Breast reduction 2005    History reviewed. No pertinent family history.  Social History History  Substance Use Topics  . Smoking status: Never Smoker   . Smokeless tobacco: Not on file  . Alcohol Use: Yes     once a week    Not on File  Current Outpatient Prescriptions  Medication Sig Dispense Refill  . medroxyPROGESTERone (DEPO-PROVERA) 150 MG/ML injection Inject 150 mg into the muscle every 3 (three) months.          Review of Systems Review of Systems  Constitutional: Negative for unexpected weight change.  HENT: Negative.   Respiratory: Negative.   Cardiovascular: Negative.   Gastrointestinal: Negative.     Blood pressure 134/90, pulse 68, temperature 97 F (36.1 C), temperature source Temporal, resp. rate 16, height 5\' 2"  (1.575 m), weight 191 lb 3.2 oz (86.728 kg).  Physical Exam Physical Exam  Constitutional: No distress.       Overweight.   HENT:  Head: Normocephalic and atraumatic.  Eyes: Conjunctivae  and EOM are normal.  Neck: Neck supple. Thyromegaly present.       Palpable right-sided fullness in the lower neck that moves with a swallow.  Cardiovascular: Normal rate and regular rhythm.   No murmur heard. Lymphadenopathy:    She has no cervical adenopathy.    Data Reviewed Dr. Ophelia Charter note.  [place] of neck.  Assessment    Multiple bilateral thyroid nodules with enlarging right-sided dominant nodule. Mild compressive symptoms.    Plan   Ultrasound-guided fine-needle aspiration of the dominant right thyroid nodule.  If this is malignant or premalignant I recommended a total thyroidectomy. If it is benign she is not interested in  having  thyroid surgery at this time.  Thus, I would refer back to Dr. Chestine Spore to see if he could try some suppressive therapy. I will discuss her results with her we have them back.       Rani Sisney J 07/02/2011, 4:49 PM

## 2011-07-09 ENCOUNTER — Ambulatory Visit
Admission: RE | Admit: 2011-07-09 | Discharge: 2011-07-09 | Disposition: A | Discharge status: Home / Self Care | Payer: PRIVATE HEALTH INSURANCE | Source: Ambulatory Visit | Attending: General Surgery | Admitting: General Surgery

## 2011-07-09 ENCOUNTER — Other Ambulatory Visit (HOSPITAL_COMMUNITY)
Admission: RE | Admit: 2011-07-09 | Discharge: 2011-07-09 | Disposition: A | Discharge status: Home / Self Care | Payer: PRIVATE HEALTH INSURANCE | Source: Ambulatory Visit | Attending: Interventional Radiology | Admitting: Interventional Radiology

## 2011-07-09 ENCOUNTER — Other Ambulatory Visit: Payer: Self-pay | Admitting: Pulmonary Disease

## 2011-07-09 DIAGNOSIS — E041 Nontoxic single thyroid nodule: Secondary | ICD-10-CM

## 2011-07-09 DIAGNOSIS — E049 Nontoxic goiter, unspecified: Secondary | ICD-10-CM | POA: Insufficient documentation

## 2011-07-12 ENCOUNTER — Telehealth (INDEPENDENT_AMBULATORY_CARE_PROVIDER_SITE_OTHER): Payer: Self-pay | Admitting: General Surgery

## 2011-07-12 NOTE — Telephone Encounter (Signed)
Patient called for results of thyroid biopsy, gave her results of negative over the phone.

## 2011-07-23 ENCOUNTER — Telehealth (INDEPENDENT_AMBULATORY_CARE_PROVIDER_SITE_OTHER): Payer: Self-pay | Admitting: General Surgery

## 2011-07-26 ENCOUNTER — Telehealth (INDEPENDENT_AMBULATORY_CARE_PROVIDER_SITE_OTHER): Payer: Self-pay | Admitting: General Surgery

## 2011-07-26 NOTE — Telephone Encounter (Signed)
Ms. Bowens was aware of her fine-needle aspiration results of her thyroid nodule which demonstrated benign follicular cells.  i wanted to discuss this with her as well. She is not interested in having any thyroid surgery at this time. I suggested she go back and see Dr. Chestine Spore discuss further followup and possible  suppression therapy with him.  She seems to understand this and agrees.

## 2011-10-01 ENCOUNTER — Other Ambulatory Visit: Payer: Self-pay | Admitting: Obstetrics

## 2012-01-25 ENCOUNTER — Encounter (HOSPITAL_COMMUNITY): Payer: Self-pay | Admitting: *Deleted

## 2012-01-25 ENCOUNTER — Other Ambulatory Visit: Payer: Self-pay

## 2012-01-25 ENCOUNTER — Inpatient Hospital Stay (HOSPITAL_COMMUNITY)
Admission: AD | Admit: 2012-01-25 | Discharge: 2012-01-26 | Disposition: A | Discharge status: Home / Self Care | Payer: PRIVATE HEALTH INSURANCE | Source: Ambulatory Visit | Attending: Obstetrics & Gynecology | Admitting: Obstetrics & Gynecology

## 2012-01-25 DIAGNOSIS — M25519 Pain in unspecified shoulder: Secondary | ICD-10-CM | POA: Insufficient documentation

## 2012-01-25 DIAGNOSIS — N6314 Unspecified lump in the right breast, lower inner quadrant: Secondary | ICD-10-CM

## 2012-01-25 DIAGNOSIS — M549 Dorsalgia, unspecified: Secondary | ICD-10-CM | POA: Insufficient documentation

## 2012-01-25 DIAGNOSIS — N63 Unspecified lump in unspecified breast: Secondary | ICD-10-CM

## 2012-01-25 HISTORY — DX: Fibromyalgia: M79.7

## 2012-01-25 HISTORY — DX: Unspecified staphylococcus as the cause of diseases classified elsewhere: B95.8

## 2012-01-25 LAB — POCT PREGNANCY, URINE: Preg Test, Ur: NEGATIVE

## 2012-01-25 MED ORDER — KETOROLAC TROMETHAMINE 60 MG/2ML IM SOLN
60.0000 mg | INTRAMUSCULAR | Status: AC
  Administered 2012-01-25: 60 mg via INTRAMUSCULAR
  Filled 2012-01-25: qty 2

## 2012-01-25 NOTE — MAU Note (Signed)
Pt reports that she has a lump in her R breast which she discovered one month ago. Pt states that she has had pain in R breast for one week. Pain is constant under R breast. A throbbing pain which radiates into upper chest and  R arm  Is intermittent.

## 2012-01-26 MED ORDER — IBUPROFEN 600 MG PO TABS: 600.0000 mg | ORAL_TABLET | Freq: Four times a day (QID) | ORAL | Status: AC | PRN

## 2012-01-26 NOTE — Discharge Instructions (Signed)
Mammogram Tips   Healthy women should begin getting mammograms every year or two once they reach age 32, and once a year when they reach age 50. Here are tips:  Find an experienced, high-volume center with accomplished radiologists. You can ask for their credentials.   Ask to see the certificate showing the center is approved by the U.S. Food and Drug Administration.   Use the same center regularly, so it is easier to compare your new mammograms with your old ones.   Bring a list of places you have had mammograms, dates, biopsies or other breast treatments. Bring old mammograms with you or have them sent to your primary caregiver.   Describe any breast problems to your caregiver or the person doing the mammogram. Be ready to give past surgeries, birth control pills, hormone use, breast implants, growths, moles, breast scars and family or personal history of breast cancer.   Call your doctor or center to check on the mammogram if you hear nothing within 10 days. Do not assume everything was normal.   To protect your privacy, the mammogram results cannot be given over the phone or to anyone but you.   Radiation from a mammogram is very low and does not pose a radiation risk.   Mammograms can detect breast problems other than breast cancer.   You may be asked stand or sit in front of the X-ray machine.   Two small plastic or glass plates are placed around the breast when taking the X-ray.   If you are menstruating, schedule your mammogram a week after your menstrual period.   Do not wear deodorants, powder or perfume when getting a mammogram.   Wash your breasts and under your arms before getting a mammogram.   Wear cloths that are easy for you to undress and dress.   Arrive at the center at least 15 minutes before the mammogram is scheduled.   There may be slight discomfort during the mammogram, but it goes away shortly after the test.   Try to relax as much as possible during the  mammogram.   Talk to your caregiver if you do not understand the results of the mammogram.   Follow the recommendations of your caregiver regarding further tests and treatments if needed.   Get a second opinion if you are concerned or question the results of the mammogram, further tests or treatment if needed.   Continue with monthly self-breast exams and yearly caregiver exams even if the mammogram is normal.   Your caregiver may recommend getting a mammogram before age 32 and more often if you are at high risk for developing breast cancer.  Document Released: 01/10/2006 Document Revised: 09/13/2011 Document Reviewed: 09/17/2008 Exit[employer] Patient Information 2012 [employer]. 

## 2012-01-26 NOTE — MAU Provider Note (Signed)
History     CSN: [id number]  Arrival date and time: 01/25/12 2229   First Provider Initiated Contact with Patient 01/25/12 2316     30 y.o.G1P0101 Chief Complaint  Patient presents with  . Breast Pain   HPI Pt presents to MAU with lump in R breast x1 month and pain in right chest, shoulder and back x1 week, that sometimes radiates into right arm.  She denies cardiac history or SOB.     OB History    Grav Para Term Preterm Abortions TAB SAB Ect Mult Living   1 1  1      1       Past Medical History  Diagnosis Date  . GERD (gastroesophageal reflux disease)   . Migraines   . Cough   . Trouble swallowing   . Staph infection   . Fibromyalgia     Past Surgical History  Procedure Date  . Cesarean section 1996  . Liposection 2010  . Breast reduction 2005    Family History  Problem Relation Age of Onset  . Anesthesia problems Neg Hx   . Alcohol abuse Father   . Heart disease Father   . Hyperlipidemia Father     History  Substance Use Topics  . Smoking status: Never Smoker   . Smokeless tobacco: Not on file  . Alcohol Use: Yes     once a week    Allergies: No Known Allergies  Prescriptions prior to admission  Medication Sig Dispense Refill  . acetaminophen (TYLENOL) 500 MG tablet Take 1,000 mg by mouth as needed. Used for headache.      . cyclobenzaprine (FLEXERIL) 10 MG tablet Take 10 mg by mouth as needed. Use for sleep and pain.      Marland Kitchen gabapentin (NEURONTIN) 100 MG capsule Take 100 mg by mouth as needed. Used for pain.  Patient states that she has fibromyalgia.      . medroxyPROGESTERone (DEPO-PROVERA) 150 MG/ML injection Inject 150 mg into the muscle every 3 (three) months.         Review of Systems  Constitutional: Negative for fever, chills and malaise/fatigue.  Eyes: Negative for blurred vision.  Respiratory: Negative for cough and shortness of breath.   Cardiovascular: Positive for chest pain.  Gastrointestinal: Negative for heartburn, nausea and  vomiting.  Genitourinary: Negative for dysuria, urgency and frequency.  Musculoskeletal: Positive for back pain.  Neurological: Negative for dizziness and headaches.  Psychiatric/Behavioral: Negative for depression.   Physical Exam   Blood pressure 134/81, pulse 96, temperature 99.9 F (37.7 C), temperature source Oral, resp. rate 18, height 5' 2.5" (1.588 m), weight 86.365 kg (190 lb 6.4 oz), SpO2 100.00%.  Physical Exam  Nursing note and vitals reviewed. Constitutional: She is oriented to person, place, and time. She appears well-developed and well-nourished.  Neck: Normal range of motion.  Cardiovascular: Normal rate, regular rhythm and normal heart sounds.   Respiratory: Effort normal and breath sounds normal.  GI: Soft.  Musculoskeletal: Normal range of motion.  Neurological: She is alert and oriented to person, place, and time.  Skin: Skin is warm and dry.  Psychiatric: She has a normal mood and affect. Her behavior is normal. Judgment and thought content normal.   Rope-like fibrous tissue palpable in right breast at 4 O'clock position.  Dense mass ~1cm in size, firm, and fixed, at center of fibrous area.     MAU Course  Procedures  MDM EKG with Normal Sinus Rhythm, Toradol IM with significant relief of  pain  Assessment and Plan  Breast lump, right breast Pain in right shoulder, back  D/C home Referral to Breast Center for diagnostic mammogram Ibuprofen for pain F/U with Gyn provider Return to MAU as needed  LEFTWICH-KIRBY, Ian Cavey 01/26/2012, 12:08 AM

## 2012-02-04 ENCOUNTER — Other Ambulatory Visit: Payer: Self-pay | Admitting: *Deleted

## 2012-02-04 ENCOUNTER — Telehealth: Payer: Self-pay | Admitting: *Deleted

## 2012-02-04 DIAGNOSIS — N6310 Unspecified lump in the right breast, unspecified quadrant: Secondary | ICD-10-CM

## 2012-02-04 NOTE — Telephone Encounter (Signed)
Telephoned patient at home # [facility] Mon May [facility] at 1:45 [facility]. Pt was happy with appts and voiced understanding.

## 2012-02-05 NOTE — Telephone Encounter (Signed)
Close encounter 

## 2012-02-08 ENCOUNTER — Ambulatory Visit
Admission: RE | Admit: 2012-02-08 | Discharge: 2012-02-08 | Disposition: A | Discharge status: Home / Self Care | Payer: PRIVATE HEALTH INSURANCE | Source: Ambulatory Visit | Attending: Obstetrics & Gynecology | Admitting: Obstetrics & Gynecology

## 2012-02-08 ENCOUNTER — Other Ambulatory Visit: Payer: Self-pay | Admitting: Obstetrics & Gynecology

## 2012-02-08 DIAGNOSIS — N6310 Unspecified lump in the right breast, unspecified quadrant: Secondary | ICD-10-CM

## 2012-02-11 ENCOUNTER — Ambulatory Visit: Payer: PRIVATE HEALTH INSURANCE | Admitting: Obstetrics & Gynecology

## 2012-05-28 ENCOUNTER — Other Ambulatory Visit: Payer: Self-pay | Admitting: Obstetrics

## 2012-11-29 ENCOUNTER — Emergency Department (HOSPITAL_COMMUNITY)
Admission: EM | Admit: 2012-11-29 | Discharge: 2012-11-30 | Disposition: A | Discharge status: Home / Self Care | Payer: BC Managed Care – PPO | Attending: Emergency Medicine | Admitting: Emergency Medicine

## 2012-11-29 ENCOUNTER — Emergency Department (HOSPITAL_COMMUNITY): Payer: BC Managed Care – PPO

## 2012-11-29 ENCOUNTER — Encounter (HOSPITAL_COMMUNITY): Payer: Self-pay | Admitting: *Deleted

## 2012-11-29 DIAGNOSIS — Y9389 Activity, other specified: Secondary | ICD-10-CM | POA: Insufficient documentation

## 2012-11-29 DIAGNOSIS — Z8719 Personal history of other diseases of the digestive system: Secondary | ICD-10-CM | POA: Insufficient documentation

## 2012-11-29 DIAGNOSIS — R091 Pleurisy: Secondary | ICD-10-CM | POA: Insufficient documentation

## 2012-11-29 DIAGNOSIS — Y92410 Unspecified street and highway as the place of occurrence of the external cause: Secondary | ICD-10-CM | POA: Insufficient documentation

## 2012-11-29 DIAGNOSIS — IMO0001 Reserved for inherently not codable concepts without codable children: Secondary | ICD-10-CM | POA: Insufficient documentation

## 2012-11-29 DIAGNOSIS — S79919A Unspecified injury of unspecified hip, initial encounter: Secondary | ICD-10-CM | POA: Insufficient documentation

## 2012-11-29 DIAGNOSIS — Z8679 Personal history of other diseases of the circulatory system: Secondary | ICD-10-CM | POA: Insufficient documentation

## 2012-11-29 DIAGNOSIS — S0993XA Unspecified injury of face, initial encounter: Secondary | ICD-10-CM | POA: Insufficient documentation

## 2012-11-29 DIAGNOSIS — S139XXA Sprain of joints and ligaments of unspecified parts of neck, initial encounter: Secondary | ICD-10-CM

## 2012-11-29 DIAGNOSIS — R040 Epistaxis: Secondary | ICD-10-CM | POA: Insufficient documentation

## 2012-11-29 DIAGNOSIS — Z8619 Personal history of other infectious and parasitic diseases: Secondary | ICD-10-CM | POA: Insufficient documentation

## 2012-11-29 LAB — CBC WITH DIFFERENTIAL/PLATELET
Basophils Absolute: 0 10*3/uL (ref 0.0–0.1)
Basophils Relative: 1 % (ref 0–1)
Eosinophils Absolute: 0 10*3/uL (ref 0.0–0.7)
MCH: 23 pg — ABNORMAL LOW (ref 26.0–34.0)
MCHC: 32.9 g/dL (ref 30.0–36.0)
Neutrophils Relative %: 34 % — ABNORMAL LOW (ref 43–77)
Platelets: 276 10*3/uL (ref 150–400)

## 2012-11-29 LAB — POCT I-STAT, CHEM 8
HCT: 40 % (ref 36.0–46.0)
Hemoglobin: 13.6 g/dL (ref 12.0–15.0)
Potassium: 3.5 mEq/L (ref 3.5–5.1)
Sodium: 140 mEq/L (ref 135–145)
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
## 2012-11-30 ENCOUNTER — Encounter (HOSPITAL_COMMUNITY): Payer: Self-pay | Admitting: Radiology

## 2012-11-30 ENCOUNTER — Emergency Department (HOSPITAL_COMMUNITY): Payer: BC Managed Care – PPO

## 2012-11-30 MED ORDER — HYDROCODONE-ACETAMINOPHEN 5-325 MG PO TABS: 1.0000 | ORAL_TABLET | ORAL | Status: DC | PRN

## 2012-11-30 MED ORDER — ONDANSETRON 4 MG PO TBDP
8.0000 mg | ORAL_TABLET | Freq: Once | ORAL | Status: AC
  Administered 2012-11-30: 8 mg via ORAL
  Filled 2012-11-30: qty 2

## 2012-11-30 MED ORDER — CYCLOBENZAPRINE HCL 10 MG PO TABS
10.0000 mg | ORAL_TABLET | Freq: Once | ORAL | Status: AC
  Administered 2012-11-30: 10 mg via ORAL
  Filled 2012-11-30: qty 1

## 2012-11-30 MED ORDER — CYCLOBENZAPRINE HCL 10 MG PO TABS: 10.0000 mg | ORAL_TABLET | Freq: Three times a day (TID) | ORAL | Status: DC | PRN

## 2012-11-30 MED ORDER — HYDROCODONE-ACETAMINOPHEN 5-325 MG PO TABS
1.0000 | ORAL_TABLET | Freq: Once | ORAL | Status: AC
  Administered 2012-11-30: 1 via ORAL
  Filled 2012-11-30: qty 1

## 2013-04-28 ENCOUNTER — Ambulatory Visit (HOSPITAL_COMMUNITY): Payer: PRIVATE HEALTH INSURANCE | Admitting: Psychology

## 2013-05-04 ENCOUNTER — Ambulatory Visit (INDEPENDENT_AMBULATORY_CARE_PROVIDER_SITE_OTHER): Payer: BC Managed Care – PPO | Admitting: Psychology

## 2013-05-04 ENCOUNTER — Encounter (HOSPITAL_COMMUNITY): Payer: Self-pay | Admitting: Psychology

## 2013-05-04 ENCOUNTER — Encounter (HOSPITAL_COMMUNITY): Payer: Self-pay

## 2013-05-04 DIAGNOSIS — F325 Major depressive disorder, single episode, in full remission: Secondary | ICD-10-CM
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
## 2013-10-21 ENCOUNTER — Emergency Department (HOSPITAL_COMMUNITY): Payer: Medicaid Other

## 2013-10-21 ENCOUNTER — Emergency Department (HOSPITAL_COMMUNITY)
Admission: EM | Admit: 2013-10-21 | Discharge: 2013-10-21 | Disposition: A | Discharge status: Home / Self Care | Payer: Medicaid Other | Attending: Emergency Medicine | Admitting: Emergency Medicine

## 2013-10-21 DIAGNOSIS — R5383 Other fatigue: Secondary | ICD-10-CM

## 2013-10-21 DIAGNOSIS — J069 Acute upper respiratory infection, unspecified: Secondary | ICD-10-CM | POA: Insufficient documentation

## 2013-10-21 DIAGNOSIS — Z8679 Personal history of other diseases of the circulatory system: Secondary | ICD-10-CM | POA: Insufficient documentation

## 2013-10-21 DIAGNOSIS — H9209 Otalgia, unspecified ear: Secondary | ICD-10-CM | POA: Insufficient documentation

## 2013-10-21 DIAGNOSIS — Z8719 Personal history of other diseases of the digestive system: Secondary | ICD-10-CM | POA: Insufficient documentation

## 2013-10-21 DIAGNOSIS — Z79899 Other long term (current) drug therapy: Secondary | ICD-10-CM | POA: Insufficient documentation

## 2013-10-21 DIAGNOSIS — Z8619 Personal history of other infectious and parasitic diseases: Secondary | ICD-10-CM | POA: Insufficient documentation

## 2013-10-21 DIAGNOSIS — R5381 Other malaise: Secondary | ICD-10-CM | POA: Insufficient documentation

## 2013-10-21 MED ORDER — ALBUTEROL SULFATE HFA 108 (90 BASE) MCG/ACT IN AERS
2.0000 | INHALATION_SPRAY | Freq: Once | RESPIRATORY_TRACT | Status: AC
  Administered 2013-10-21: 2 via RESPIRATORY_TRACT
  Filled 2013-10-21: qty 6.7

## 2013-10-21 NOTE — ED Provider Notes (Addendum)
CSN: [id number]     Arrival date & time 10/21/13  0703 History   First MD Initiated Contact with Patient 10/21/13 [phone]     Chief Complaint  Patient presents with  . Cough    The history is provided by the patient.   patient reports cough headache and generalized fatigue as well as sinus congestion ear pain over the past week.  She's tried TheraFlu without improvement in her symptoms.  Yesterday she had one episode of posttussive emesis.  She states the cough is persistent.  Chills without documented fever.  No shortness of breath at this time.  She denies nausea vomiting diarrhea.  No abdominal pain.  Symptoms are mild in severity.  Recent sick contact was family member with similar symptoms.   Past Medical History  Diagnosis Date  . GERD (gastroesophageal reflux disease)   . Migraines   . Cough   . Trouble swallowing   . Staph infection   . Fibromyalgia    Past Surgical History  Procedure Laterality Date  . Cesarean section  1996  . Liposection  2010  . Breast reduction  2005   Family History  Problem Relation Age of Onset  . Anesthesia problems Neg Hx   . Alcohol abuse Father   . Heart disease Father   . Hyperlipidemia Father    History  Substance Use Topics  . Smoking status: Never Smoker   . Smokeless tobacco: Not on file  . Alcohol Use: Yes     Comment: once a week   OB History   Grav Para Term Preterm Abortions TAB SAB Ect Mult Living   1 1  1      1      Review of Systems  Respiratory: Positive for cough.   All other systems reviewed and are negative.    Allergies  Review of patient's allergies indicates no known allergies.  Home Medications   Current Outpatient Rx  Name  Route  Sig  Dispense  Refill  . Chlorphen-Pseudoephed-APAP (THERAFLU FLU/COLD PO)   Oral   Take 1 packet by mouth daily.         . medroxyPROGESTERone (DEPO-PROVERA) 150 MG/ML injection   Intramuscular   Inject 150 mg into the muscle every 3 (three) months. Next dose due  12/20/12.          BP 134/89  Pulse 90  Temp(Src) 98.8 F (37.1 C) (Oral)  Resp 18  SpO2 100% Physical Exam  Nursing note and vitals reviewed. Constitutional: She is oriented to person, place, and time. She appears well-developed and well-nourished. No distress.  HENT:  Head: Normocephalic and atraumatic.  Right Ear: Tympanic membrane, external ear and ear canal normal.  Left Ear: Tympanic membrane, external ear and ear canal normal.  Mouth/Throat: Uvula is midline, oropharynx is clear and moist and mucous membranes are normal. No posterior oropharyngeal erythema or tonsillar abscesses.  Eyes: EOM are normal.  Neck: Normal range of motion.  Cardiovascular: Normal rate, regular rhythm and normal heart sounds.   Pulmonary/Chest: Effort normal and breath sounds normal.  Abdominal: Soft. She exhibits no distension. There is no tenderness.  Musculoskeletal: Normal range of motion.  Neurological: She is alert and oriented to person, place, and time.  Skin: Skin is warm and dry.  Psychiatric: She has a normal mood and affect. Judgment normal.    ED Course  Procedures (including critical care time) Labs Review Labs Reviewed - No data to display Imaging Review No results found.  EKG Interpretation  None       MDM   1. Viral URI    Likely viral upper respiratory tract infections.  The patient is well-appearing.  She is nontoxic.  No hypoxia on exam.  Lung exam is clear.  Normal work of breathing.  No indication for chest x-ray.  Close followup with PCP     Lyanne CoKevin M Amour Trigg, MD 10/21/13 [id number]  Lyanne CoKevin M Cyniah Gossard, MD 10/21/13 [phone]

## 2013-10-21 NOTE — ED Notes (Signed)
Per pt, cough, headache, fatigue, sinus congestion with ear pain x 1 week.  Pt was taking Theraflu.

## 2013-10-21 NOTE — ED Notes (Signed)
She tells me she has had cough/congestion "for over a week now".  She states she coughed so stridently yesterday that she "threw up".  She also c/o bilat. Earache R > L.  She is awake, alert and in no distress.

## 2013-11-24 ENCOUNTER — Emergency Department (HOSPITAL_COMMUNITY)
Admission: EM | Admit: 2013-11-24 | Discharge: 2013-11-24 | Disposition: A | Discharge status: Home / Self Care | Payer: Medicaid Other | Attending: Emergency Medicine | Admitting: Emergency Medicine

## 2013-11-24 ENCOUNTER — Encounter (HOSPITAL_COMMUNITY): Payer: Self-pay | Admitting: Emergency Medicine

## 2013-11-24 DIAGNOSIS — M5380 Other specified dorsopathies, site unspecified: Secondary | ICD-10-CM | POA: Insufficient documentation

## 2013-11-24 DIAGNOSIS — Z8619 Personal history of other infectious and parasitic diseases: Secondary | ICD-10-CM | POA: Insufficient documentation

## 2013-11-24 DIAGNOSIS — M542 Cervicalgia: Secondary | ICD-10-CM | POA: Insufficient documentation

## 2013-11-24 DIAGNOSIS — Z8719 Personal history of other diseases of the digestive system: Secondary | ICD-10-CM | POA: Insufficient documentation

## 2013-11-24 DIAGNOSIS — Z8679 Personal history of other diseases of the circulatory system: Secondary | ICD-10-CM | POA: Insufficient documentation

## 2013-11-24 DIAGNOSIS — Z79899 Other long term (current) drug therapy: Secondary | ICD-10-CM | POA: Insufficient documentation

## 2013-11-24 DIAGNOSIS — M62830 Muscle spasm of back: Secondary | ICD-10-CM

## 2013-11-24 DIAGNOSIS — IMO0001 Reserved for inherently not codable concepts without codable children: Secondary | ICD-10-CM | POA: Insufficient documentation

## 2013-11-24 DIAGNOSIS — M62838 Other muscle spasm: Secondary | ICD-10-CM | POA: Insufficient documentation

## 2013-11-24 MED ORDER — DIAZEPAM 5 MG PO TABS: 5.0000 mg | ORAL_TABLET | Freq: Two times a day (BID) | ORAL | Status: AC

## 2013-11-24 MED ORDER — KETOROLAC TROMETHAMINE 60 MG/2ML IM SOLN
60.0000 mg | Freq: Once | INTRAMUSCULAR | Status: AC
Start: 2013-11-24 — End: 2013-11-24
  Administered 2013-11-24: 60 mg via INTRAMUSCULAR
  Filled 2013-11-24: qty 2
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
## 2013-11-24 NOTE — ED Notes (Signed)
Pt a+ox4, presents with c/o R neck/shoulder pain x3 days.  Pt reports hx fibromyalgia and "i thought it was that", but pt reports pain is different now.  8/10 pain.  Pt denies known injury.  MAEI.  +csm/+pulses.  Pt denies n/t to extremities.  Pt denies fevers/chills.  No obvious deformities/swelling noted.  Skin PWD.  Speaking full/clear sentences, rr even/un-lab.  NAD.

## 2013-11-26 ENCOUNTER — Encounter (HOSPITAL_COMMUNITY): Payer: Self-pay | Admitting: Emergency Medicine

## 2013-11-26 ENCOUNTER — Emergency Department (HOSPITAL_COMMUNITY)
Admission: EM | Admit: 2013-11-26 | Discharge: 2013-11-26 | Disposition: A | Discharge status: Home / Self Care | Payer: Medicaid Other | Attending: Emergency Medicine | Admitting: Emergency Medicine

## 2013-11-26 DIAGNOSIS — R1310 Dysphagia, unspecified: Secondary | ICD-10-CM | POA: Insufficient documentation

## 2013-11-26 DIAGNOSIS — M2550 Pain in unspecified joint: Secondary | ICD-10-CM | POA: Insufficient documentation

## 2013-11-26 DIAGNOSIS — M5412 Radiculopathy, cervical region: Secondary | ICD-10-CM | POA: Insufficient documentation

## 2013-11-26 DIAGNOSIS — R059 Cough, unspecified: Secondary | ICD-10-CM | POA: Insufficient documentation

## 2013-11-26 DIAGNOSIS — R05 Cough: Secondary | ICD-10-CM | POA: Insufficient documentation

## 2013-11-26 DIAGNOSIS — Z8619 Personal history of other infectious and parasitic diseases: Secondary | ICD-10-CM | POA: Insufficient documentation

## 2013-11-26 DIAGNOSIS — G43909 Migraine, unspecified, not intractable, without status migrainosus: Secondary | ICD-10-CM | POA: Insufficient documentation

## 2013-11-26 DIAGNOSIS — IMO0001 Reserved for inherently not codable concepts without codable children: Secondary | ICD-10-CM | POA: Insufficient documentation

## 2013-11-26 DIAGNOSIS — K219 Gastro-esophageal reflux disease without esophagitis: Secondary | ICD-10-CM | POA: Insufficient documentation

## 2013-11-26 MED ORDER — OXYCODONE-ACETAMINOPHEN 5-325 MG PO TABS
2.0000 | ORAL_TABLET | ORAL | Status: DC | PRN
Start: 2013-11-26 — End: 2014-01-30

## 2013-11-26 MED ORDER — PREDNISONE 20 MG PO TABS: 40.0000 mg | ORAL_TABLET | Freq: Every day | ORAL | Status: DC

## 2013-11-26 NOTE — Discharge Instructions (Signed)
Cervical Radiculopathy  Cervical radiculopathy happens when a nerve in the neck is pinched or bruised by a slipped (herniated) disk or by arthritic changes in the bones of the cervical spine. This can occur due to an injury or as part of the normal aging process. Pressure on the cervical nerves can cause pain or numbness that runs from your neck all the way down into your arm and fingers.  CAUSES   There are many possible causes, including:  · Injury.  · Muscle tightness in the neck from overuse.  · Swollen, painful joints (arthritis).  · Breakdown or degeneration in the bones and joints of the spine (spondylosis) due to aging.  · Bone spurs that may develop near the cervical nerves.  SYMPTOMS   Symptoms include pain, weakness, or numbness in the affected arm and hand. Pain can be severe or irritating. Symptoms may be worse when extending or turning the neck.  DIAGNOSIS   Your caregiver will ask about your symptoms and do a physical exam. He or she may test your strength and reflexes. X-rays, CT scans, and MRI scans may be needed in cases of injury or if the symptoms do not go away after a period of time. Electromyography (EMG) or nerve conduction testing may be done to study how your nerves and muscles are working.  TREATMENT   Your caregiver may recommend certain exercises to help relieve your symptoms. Cervical radiculopathy can, and often does, get better with time and treatment. If your problems continue, treatment options may include:  · Wearing a soft collar for short periods of time.  · Physical therapy to strengthen the neck muscles.  · Medicines, such as nonsteroidal anti-inflammatory drugs (NSAIDs), oral corticosteroids, or spinal injections.  · Surgery. Different types of surgery may be done depending on the cause of your problems.  HOME CARE INSTRUCTIONS   · Put ice on the affected area.  · Put ice in a plastic bag.  · Place a towel between your skin and the bag.  · Leave the ice on for 15-20 minutes,  03-04 times a day or as directed by your caregiver.  · If ice does not help, you can try using heat. Take a warm shower or bath, or use a hot water bottle as directed by your caregiver.  · You may try a gentle neck and shoulder massage.  · Use a flat pillow when you sleep.  · Only take over-the-counter or prescription medicines for pain, discomfort, or fever as directed by your caregiver.  · If physical therapy was prescribed, follow your caregiver's directions.  · If a soft collar was prescribed, use it as directed.  SEEK IMMEDIATE MEDICAL CARE IF:   · Your pain gets much worse and cannot be controlled with medicines.  · You have weakness or numbness in your hand, arm, face, or leg.  · You have a high fever or a stiff, rigid neck.  · You lose bowel or bladder control (incontinence).  · You have trouble with walking, balance, or speaking.  MAKE SURE YOU:   · Understand these instructions.  · Will watch your condition.  · Will get help right away if you are not doing well or get worse.  Document Released: 06/19/2001 Document Revised: 12/17/2011 Document Reviewed: 05/08/2011  [employer]® Patient Information ©2014 [employer].
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
## 2013-11-26 NOTE — ED Notes (Signed)
Pt. reports persistent muscle spasms / pain at right side of neck radiating to right shoulder and right upper arm worse palpation , movement and certain positions , [facility] 11/24/2013 discharged home with prescription Valium .

## 2013-11-28 NOTE — ED Provider Notes (Signed)
Medical screening examination/treatment/procedure(s) were performed by non-physician practitioner and as supervising physician I was immediately available for consultation/collaboration.  EKG Interpretation   None        Aarika Moon, MD 11/28/13 0939 

## 2013-12-04 NOTE — ED Provider Notes (Signed)
Medical screening examination/treatment/procedure(s) were performed by non-physician practitioner and as supervising physician I was immediately available for consultation/collaboration.  Decklan Mau Y. Jory Welke, MD 12/04/13 1036 

## 2013-12-16 ENCOUNTER — Emergency Department (HOSPITAL_COMMUNITY): Payer: Medicaid Other

## 2013-12-16 ENCOUNTER — Encounter (HOSPITAL_COMMUNITY): Payer: Self-pay | Admitting: Emergency Medicine

## 2013-12-16 ENCOUNTER — Emergency Department (HOSPITAL_COMMUNITY)
Admission: EM | Admit: 2013-12-16 | Discharge: 2013-12-16 | Disposition: A | Discharge status: Home / Self Care | Payer: Medicaid Other | Attending: Emergency Medicine | Admitting: Emergency Medicine

## 2013-12-16 DIAGNOSIS — G43909 Migraine, unspecified, not intractable, without status migrainosus: Secondary | ICD-10-CM | POA: Insufficient documentation

## 2013-12-16 DIAGNOSIS — Z8619 Personal history of other infectious and parasitic diseases: Secondary | ICD-10-CM | POA: Insufficient documentation

## 2013-12-16 DIAGNOSIS — R51 Headache: Secondary | ICD-10-CM | POA: Insufficient documentation

## 2013-12-16 DIAGNOSIS — Z8719 Personal history of other diseases of the digestive system: Secondary | ICD-10-CM | POA: Insufficient documentation

## 2013-12-16 DIAGNOSIS — Z79899 Other long term (current) drug therapy: Secondary | ICD-10-CM | POA: Insufficient documentation

## 2013-12-16 DIAGNOSIS — R079 Chest pain, unspecified: Secondary | ICD-10-CM | POA: Insufficient documentation

## 2013-12-16 DIAGNOSIS — M5412 Radiculopathy, cervical region: Secondary | ICD-10-CM

## 2013-12-16 DIAGNOSIS — Z791 Long term (current) use of non-steroidal anti-inflammatories (NSAID): Secondary | ICD-10-CM | POA: Insufficient documentation

## 2013-12-16 DIAGNOSIS — IMO0001 Reserved for inherently not codable concepts without codable children: Secondary | ICD-10-CM | POA: Insufficient documentation

## 2013-12-16 LAB — URINALYSIS, ROUTINE W REFLEX MICROSCOPIC
Bilirubin Urine: NEGATIVE
Glucose, UA: NEGATIVE mg/dL
Hgb urine dipstick: NEGATIVE
KETONES UR: NEGATIVE mg/dL
NITRITE: NEGATIVE
PROTEIN: NEGATIVE mg/dL
Specific Gravity, Urine: 1.024 (ref 1.005–1.030)
UROBILINOGEN UA: 0.2 mg/dL (ref 0.0–1.0)
pH: 6 (ref 5.0–8.0)

## 2013-12-16 LAB — I-STAT TROPONIN, ED: TROPONIN I, POC: 0 ng/mL (ref 0.00–0.08)

## 2013-12-16 LAB — URINE MICROSCOPIC-ADD ON

## 2013-12-16 LAB — BASIC METABOLIC PANEL
BUN: 7 mg/dL (ref 6–23)
CALCIUM: 9.3 mg/dL (ref 8.4–10.5)
CO2: 23 meq/L (ref 19–32)
Chloride: 102 mEq/L (ref 96–112)
Creatinine, Ser: 0.64 mg/dL (ref 0.50–1.10)
GFR calc Af Amer: >90 mL/min (ref >90)
GLUCOSE: 94 mg/dL (ref 70–99)
Potassium: 4.1 mEq/L (ref 3.7–5.3)
SODIUM: 137 meq/L (ref 137–147)

## 2013-12-16 LAB — CBC
HEMATOCRIT: 37.6 % (ref 36.0–46.0)
HEMOGLOBIN: 12 g/dL (ref 12.0–15.0)
MCH: 23.1 pg — AB (ref 26.0–34.0)
MCHC: 31.9 g/dL (ref 30.0–36.0)
MCV: 72.4 fL — ABNORMAL LOW (ref 78.0–100.0)
Platelets: 237 10*3/uL (ref 150–400)
RBC: 5.19 MIL/uL — AB (ref 3.87–5.11)
RDW: 14.3 % (ref 11.5–15.5)
WBC: 5.4 10*3/uL (ref 4.0–10.5)

## 2013-12-16 LAB — RAPID URINE DRUG SCREEN, HOSP PERFORMED
AMPHETAMINES: NOT DETECTED
BARBITURATES: NOT DETECTED
BENZODIAZEPINES: POSITIVE — AB
Cocaine: NOT DETECTED
Opiates: NOT DETECTED
Tetrahydrocannabinol: NOT DETECTED

## 2013-12-16 LAB — PROTIME-INR
INR: 0.97 (ref 0.00–1.49)
Prothrombin Time: 12.7 seconds (ref 11.6–15.2)

## 2013-12-16 LAB — APTT: APTT: 29 s (ref 24–37)

## 2013-12-16 LAB — ETHANOL: Alcohol, Ethyl (B): <11 mg/dL (ref 0–11)

## 2013-12-16 NOTE — ED Provider Notes (Signed)
CSN: [id number]     Arrival date & time 12/16/13  7829 History   First MD Initiated Contact with Patient 12/16/13 1004     Chief Complaint  Patient presents with  . Chest Pain  . left arm tingling      (Consider location/radiation/quality/duration/timing/severity/associated sxs/prior Treatment) HPI Comments: Patient presents to the ED with a chief complaint of CP and left arm numbness that started this morning at around 7.  She states that she also has had a headache about the same amount of time.  She denies SOB, diaphoresis, nausea, or vomiting.  There are no aggravating or alleviating factors.  She was seen here recently and treated for muscle spasm in the upper back and neck.  She states that she is in the process of getting scheduled to have an MRI.  The history is provided by the patient. No language interpreter was used.    Past Medical History  Diagnosis Date  . GERD (gastroesophageal reflux disease)   . Migraines   . Cough   . Trouble swallowing   . Staph infection   . Fibromyalgia    Past Surgical History  Procedure Laterality Date  . Cesarean section  1996  . Liposection  2010  . Breast reduction  2005   Family History  Problem Relation Age of Onset  . Anesthesia problems Neg Hx   . Alcohol abuse Father   . Heart disease Father   . Hyperlipidemia Father    History  Substance Use Topics  . Smoking status: Never Smoker   . Smokeless tobacco: Never Used  . Alcohol Use: Yes     Comment: once a week   OB History   Grav Para Term Preterm Abortions TAB SAB Ect Mult Living   1 1  1      1      Review of Systems  All other systems reviewed and are negative.      Allergies  Review of patient's allergies indicates no known allergies.  Home Medications   Current Outpatient Rx  Name  Route  Sig  Dispense  Refill  . cyclobenzaprine (FLEXERIL) 10 MG tablet   Oral   Take 10 mg by mouth 4 (four) times daily as needed for muscle spasms.         . diazepam  (VALIUM) 5 MG tablet   Oral   Take 1 tablet (5 mg total) by mouth 2 (two) times daily.   10 tablet   0   . diclofenac (VOLTAREN) 75 MG EC tablet   Oral   Take 150 mg by mouth 2 (two) times daily.         Marland Kitchen gabapentin (NEURONTIN) 100 MG capsule   Oral   Take 100 mg by mouth 3 (three) times daily as needed (pain).         Marland Kitchen oxyCODONE-acetaminophen (PERCOCET/ROXICET) 5-325 MG per tablet   Oral   Take 2 tablets by mouth every 4 (four) hours as needed for severe pain.   6 tablet   0    BP 120/69  Pulse 85  Temp(Src) 98.9 F (37.2 C) (Oral)  Resp 20  Ht 5\' 2"  (1.575 m)  Wt 185 lb (83.915 kg)  BMI 33.83 kg/m2  SpO2 100% Physical Exam  Nursing note and vitals reviewed. Constitutional: She is oriented to person, place, and time. She appears well-developed and well-nourished.  HENT:  Head: Normocephalic and atraumatic.  Eyes: Conjunctivae and EOM are normal. Pupils are equal, round, and reactive  to light.  Neck: Normal range of motion. Neck supple.  Cardiovascular: Normal rate and regular rhythm.  Exam reveals no gallop and no friction rub.   No murmur heard. Pulmonary/Chest: Effort normal and breath sounds normal. No respiratory distress. She has no wheezes. She has no rales. She exhibits no tenderness.  Abdominal: Soft. Bowel sounds are normal. She exhibits no distension and no mass. There is no tenderness. There is no rebound and no guarding.  Musculoskeletal: Normal range of motion. She exhibits no edema and no tenderness.  5/5 ROM and strength throughout,   Neurological: She is alert and oriented to person, place, and time.  CN 3-12 intact, normal sensation and strength throughout normal finger to nose, no pronator drift, normal heel to shin   Skin: Skin is warm and dry.  Psychiatric: She has a normal mood and affect. Her behavior is normal. Judgment and thought content normal.    ED Course  Procedures (including critical care time) Labs Review Labs Reviewed  CBC   BASIC METABOLIC PANEL  ETHANOL  PROTIME-INR  APTT  URINE RAPID DRUG SCREEN (HOSP PERFORMED)  URINALYSIS, ROUTINE W REFLEX MICROSCOPIC  I-STAT TROPOININ, ED   Imaging Review Dg Chest Port 1 View  12/16/2013   CLINICAL DATA Chest pain  EXAM PORTABLE CHEST - 1 VIEW  COMPARISON November 29, 2012  FINDINGS Lungs are clear. Heart size and pulmonary vascularity are normal. No adenopathy. There is shift of the upper thoracic trachea toward the right. No bone lesions.  IMPRESSION Shift of upper thoracic trachea toward the right. Suspect thyroid enlargement. Lungs clear.  SIGNATURE  Electronically Signed   By: Bretta BangWilliam  Woodruff M.D.   On: 12/16/2013 10:14     EKG Interpretation   Date/Time:  Wednesday December 16 2013 09:33:52 EDT Ventricular Rate:  85 PR Interval:  162 QRS Duration: 84 QT Interval:  350 QTC Calculation: 416 R Axis:   43 Text Interpretation:  Sinus rhythm Borderline T abnormalities, lateral  leads Baseline wander in lead(s) II III aVL aVF V2 since last tracing no  significant change Confirmed by Effie ShyWENTZ  MD, ELLIOTT ([id number]) on 12/16/2013  9:53:42 AM      MDM   Final diagnoses:  Cervical radiculopathy    Labs and imaging are unremarkable. EKG is unchanged. Troponin is negative. Heart score is 1.  PERC negative.  Patient is being worked up by her PCP for cervical radiculopathy. She has an MRI scheduled. Believe this is a source the patient's symptoms. She did not have any bowel or bladder incontinence. She has normal range of motion and strength in her extremities. Vital signs are stable. Labs and workup unremarkable today. Will discharge to home with primary care followup. Patient understands and agrees to plan. Return precautions are given.    Roxy Horsemanobert Tin Engram, PA-C 12/16/13 1245

## 2013-12-16 NOTE — ED Notes (Signed)
  Pt transported to ct 

## 2013-12-16 NOTE — ED Notes (Signed)
Pt to ed from home with cp and left arm numbness.  sts she she had just got off at work when the pain started.  Denies hx of cp prior.

## 2013-12-16 NOTE — ED Provider Notes (Signed)
Medical screening examination/treatment/procedure(s) were performed by non-physician practitioner and as supervising physician I was immediately available for consultation/collaboration.  Hercules Hasler L Kadajah Kjos, MD 12/16/13 1541 

## 2013-12-16 NOTE — ED Notes (Signed)
Pt dc to home. Pt sts understanding to dc instructions pt ambulatory to exit without difficulty. Denies need for w/c.

## 2013-12-16 NOTE — Discharge Instructions (Signed)
Cervical Radiculopathy  Cervical radiculopathy happens when a nerve in the neck is pinched or bruised by a slipped (herniated) disk or by arthritic changes in the bones of the cervical spine. This can occur due to an injury or as part of the normal aging process. Pressure on the cervical nerves can cause pain or numbness that runs from your neck all the way down into your arm and fingers.  CAUSES   There are many possible causes, including:  · Injury.  · Muscle tightness in the neck from overuse.  · Swollen, painful joints (arthritis).  · Breakdown or degeneration in the bones and joints of the spine (spondylosis) due to aging.  · Bone spurs that may develop near the cervical nerves.  SYMPTOMS   Symptoms include pain, weakness, or numbness in the affected arm and hand. Pain can be severe or irritating. Symptoms may be worse when extending or turning the neck.  DIAGNOSIS   Your caregiver will ask about your symptoms and do a physical exam. He or she may test your strength and reflexes. X-rays, CT scans, and MRI scans may be needed in cases of injury or if the symptoms do not go away after a period of time. Electromyography (EMG) or nerve conduction testing may be done to study how your nerves and muscles are working.  TREATMENT   Your caregiver may recommend certain exercises to help relieve your symptoms. Cervical radiculopathy can, and often does, get better with time and treatment. If your problems continue, treatment options may include:  · Wearing a soft collar for short periods of time.  · Physical therapy to strengthen the neck muscles.  · Medicines, such as nonsteroidal anti-inflammatory drugs (NSAIDs), oral corticosteroids, or spinal injections.  · Surgery. Different types of surgery may be done depending on the cause of your problems.  HOME CARE INSTRUCTIONS   · Put ice on the affected area.  · Put ice in a plastic bag.  · Place a towel between your skin and the bag.  · Leave the ice on for 15-20 minutes,  03-04 times a day or as directed by your caregiver.  · If ice does not help, you can try using heat. Take a warm shower or bath, or use a hot water bottle as directed by your caregiver.  · You may try a gentle neck and shoulder massage.  · Use a flat pillow when you sleep.  · Only take over-the-counter or prescription medicines for pain, discomfort, or fever as directed by your caregiver.  · If physical therapy was prescribed, follow your caregiver's directions.  · If a soft collar was prescribed, use it as directed.  SEEK IMMEDIATE MEDICAL CARE IF:   · Your pain gets much worse and cannot be controlled with medicines.  · You have weakness or numbness in your hand, arm, face, or leg.  · You have a high fever or a stiff, rigid neck.  · You lose bowel or bladder control (incontinence).  · You have trouble with walking, balance, or speaking.  MAKE SURE YOU:   · Understand these instructions.  · Will watch your condition.  · Will get help right away if you are not doing well or get worse.  Document Released: 06/19/2001 Document Revised: 12/17/2011 Document Reviewed: 05/08/2011  [employer]® Patient Information ©2014 [employer].

## 2014-01-30 ENCOUNTER — Encounter (HOSPITAL_COMMUNITY): Payer: Self-pay | Admitting: Emergency Medicine

## 2014-01-30 ENCOUNTER — Emergency Department (HOSPITAL_COMMUNITY)
Admission: EM | Admit: 2014-01-30 | Discharge: 2014-01-30 | Disposition: A | Discharge status: Home / Self Care | Payer: Medicaid Other | Attending: Emergency Medicine | Admitting: Emergency Medicine

## 2014-01-30 DIAGNOSIS — K219 Gastro-esophageal reflux disease without esophagitis: Secondary | ICD-10-CM | POA: Insufficient documentation

## 2014-01-30 DIAGNOSIS — M5412 Radiculopathy, cervical region: Secondary | ICD-10-CM | POA: Insufficient documentation

## 2014-01-30 DIAGNOSIS — Z79899 Other long term (current) drug therapy: Secondary | ICD-10-CM | POA: Insufficient documentation

## 2014-01-30 DIAGNOSIS — R51 Headache: Secondary | ICD-10-CM | POA: Insufficient documentation

## 2014-01-30 DIAGNOSIS — IMO0001 Reserved for inherently not codable concepts without codable children: Secondary | ICD-10-CM | POA: Insufficient documentation

## 2014-01-30 DIAGNOSIS — Z8669 Personal history of other diseases of the nervous system and sense organs: Secondary | ICD-10-CM | POA: Insufficient documentation

## 2014-01-30 DIAGNOSIS — R1310 Dysphagia, unspecified: Secondary | ICD-10-CM | POA: Insufficient documentation

## 2014-01-30 MED ORDER — DEXAMETHASONE SODIUM PHOSPHATE 10 MG/ML IJ SOLN
10.0000 mg | Freq: Once | INTRAMUSCULAR | Status: AC
Start: 2014-01-30 — End: 2014-01-30
  Administered 2014-01-30: 10 mg via INTRAMUSCULAR
  Filled 2014-01-30: qty 1

## 2014-01-30 MED ORDER — NAPROXEN 500 MG PO TABS: 500.0000 mg | ORAL_TABLET | Freq: Two times a day (BID) | ORAL | Status: DC

## 2014-01-30 MED ORDER — OXYCODONE-ACETAMINOPHEN 5-325 MG PO TABS: 1.0000 | ORAL_TABLET | ORAL | Status: DC | PRN

## 2014-01-30 MED ORDER — KETOROLAC TROMETHAMINE 60 MG/2ML IM SOLN
60.0000 mg | Freq: Once | INTRAMUSCULAR | Status: AC
  Administered 2014-01-30: 60 mg via INTRAMUSCULAR
  Filled 2014-01-30: qty 2

## 2014-01-30 NOTE — Discharge Instructions (Signed)
Chronic Pain Discharge Instructions  Emergency care providers appreciate that many patients coming to us are in severe pain and we wish to address their pain in the safest, most responsible manner.  It is important to recognize however, that the proper treatment of chronic pain differs from that of the pain of injuries and acute illnesses.  Our goal is to provide quality, safe, personalized care and we thank you for giving us the opportunity to serve you. The use of narcotics and related agents for chronic pain syndromes may lead to additional physical and psychological problems.  Nearly as many people die from prescription narcotics each year as die from car crashes.  Additionally, this risk is increased if such prescriptions are obtained from a variety of sources.  Therefore, only your primary care physician or a pain management specialist is able to safely treat such syndromes with narcotic medications long-term.    Documentation revealing such prescriptions have been sought from multiple sources may prohibit us from providing a refill or different narcotic medication.  Your name may be checked first through the [facility] Controlled Substances Reporting System.  This database is a record of controlled substance medication prescriptions that the patient has received.  This has been established by [facility] in an effort to eliminate the dangerous, and often life threatening, practice of obtaining multiple prescriptions from different medical providers.   If you have a chronic pain syndrome (i.e. chronic headaches, recurrent back or neck pain, dental pain, abdominal or pelvis pain without a specific diagnosis, or neuropathic pain such as fibromyalgia) or recurrent visits for the same condition without an acute diagnosis, you may be treated with non-narcotics and other non-addictive medicines.  Allergic reactions or negative side effects that may be reported by a patient to such medications will not  typically lead to the use of a narcotic analgesic or other controlled substance as an alternative.   Patients managing chronic pain with a personal physician should have provisions in place for breakthrough pain.  If you are in crisis, you should call your physician.  If your physician directs you to the emergency department, please have the doctor call and speak to our attending physician concerning your care.   When patients come to the Emergency Department (ED) with acute medical conditions in which the Emergency Department physician feels appropriate to prescribe narcotic or sedating pain medication, the physician will prescribe these in very limited quantities.  The amount of these medications will last only until you can see your primary care physician in his/her office.  Any patient who returns to the ED seeking refills should expect only non-narcotic pain medications.   In the event of an acute medical condition exists and the emergency physician feels it is necessary that the patient be given a narcotic or sedating medication -  a responsible adult driver should be present in the room prior to the medication being given by the nurse.   Prescriptions for narcotic or sedating medications that have been lost, stolen or expired will not be refilled in the Emergency Department.    Patients who have chronic pain may receive non-narcotic prescriptions until seen by their primary care physician.  It is every patients personal responsibility to maintain active prescriptions with his or her primary care physician or specialist.  Cervical Radiculopathy Cervical radiculopathy happens when a nerve in the neck is pinched or bruised by a slipped (herniated) disk or by arthritic changes in the bones of the cervical spine. This can occur  due to an injury or as part of the normal aging process. Pressure on the cervical nerves can cause pain or numbness that runs from your neck all the way down into your arm and  fingers. CAUSES  There are many possible causes, including:  Injury.  Muscle tightness in the neck from overuse.  Swollen, painful joints (arthritis).  Breakdown or degeneration in the bones and joints of the spine (spondylosis) due to aging.  Bone spurs that may develop near the cervical nerves. SYMPTOMS  Symptoms include pain, weakness, or numbness in the affected arm and hand. Pain can be severe or irritating. Symptoms may be worse when extending or turning the neck. DIAGNOSIS  Your caregiver will ask about your symptoms and do a physical exam. He or she may test your strength and reflexes. X-rays, CT scans, and MRI scans may be needed in cases of injury or if the symptoms do not go away after a period of time. Electromyography (EMG) or nerve conduction testing may be done to study how your nerves and muscles are working. TREATMENT  Your caregiver may recommend certain exercises to help relieve your symptoms. Cervical radiculopathy can, and often does, get better with time and treatment. If your problems continue, treatment options may include:  Wearing a soft collar for short periods of time.  Physical therapy to strengthen the neck muscles.  Medicines, such as nonsteroidal anti-inflammatory drugs (NSAIDs), oral corticosteroids, or spinal injections.  Surgery. Different types of surgery may be done depending on the cause of your problems. HOME CARE INSTRUCTIONS   Put ice on the affected area.  Put ice in a plastic bag.  Place a towel between your skin and the bag.  Leave the ice on for 15-20 minutes, 03-04 times a day or as directed by your caregiver.  If ice does not help, you can try using heat. Take a warm shower or bath, or use a hot water bottle as directed by your caregiver.  You may try a gentle neck and shoulder massage.  Use a flat pillow when you sleep.  Only take over-the-counter or prescription medicines for pain, discomfort, or fever as directed by your  caregiver.  If physical therapy was prescribed, follow your caregiver's directions.  If a soft collar was prescribed, use it as directed. SEEK IMMEDIATE MEDICAL CARE IF:   Your pain gets much worse and cannot be controlled with medicines.  You have weakness or numbness in your hand, arm, face, or leg.  You have a high fever or a stiff, rigid neck.  You lose bowel or bladder control (incontinence).  You have trouble with walking, balance, or speaking. MAKE SURE YOU:   Understand these instructions.  Will watch your condition.  Will get help right away if you are not doing well or get worse. Document Released: 06/19/2001 Document Revised: 12/17/2011 Document Reviewed: 05/08/2011 [facility] Patient Information 2014 [place].

## 2014-01-30 NOTE — ED Notes (Signed)
Pt comfortable with discharge and follow up instructions. Prescriptions x2. 
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
## 2014-02-04 NOTE — ED Provider Notes (Signed)
Medical screening examination/treatment/procedure(s) were performed by non-physician practitioner and as supervising physician I was immediately available for consultation/collaboration.   EKG Interpretation None        Madiline Saffran, MD 02/04/14 1654 

## 2014-06-02 ENCOUNTER — Encounter (HOSPITAL_COMMUNITY): Payer: Self-pay | Admitting: *Deleted

## 2014-06-02 ENCOUNTER — Inpatient Hospital Stay (HOSPITAL_COMMUNITY)
Admission: AD | Admit: 2014-06-02 | Discharge: 2014-06-02 | Disposition: A | Discharge status: Home / Self Care | Payer: BC Managed Care – PPO | Source: Ambulatory Visit | Attending: Obstetrics | Admitting: Obstetrics

## 2014-06-02 DIAGNOSIS — R102 Pelvic and perineal pain: Secondary | ICD-10-CM

## 2014-06-02 DIAGNOSIS — K219 Gastro-esophageal reflux disease without esophagitis: Secondary | ICD-10-CM | POA: Insufficient documentation

## 2014-06-02 DIAGNOSIS — N949 Unspecified condition associated with female genital organs and menstrual cycle: Secondary | ICD-10-CM | POA: Diagnosis not present

## 2014-06-02 LAB — URINALYSIS, ROUTINE W REFLEX MICROSCOPIC
BILIRUBIN URINE: NEGATIVE
GLUCOSE, UA: NEGATIVE mg/dL
HGB URINE DIPSTICK: NEGATIVE
KETONES UR: NEGATIVE mg/dL
Leukocytes, UA: NEGATIVE
Nitrite: NEGATIVE
PH: 6 (ref 5.0–8.0)
Protein, ur: NEGATIVE mg/dL
SPECIFIC GRAVITY, URINE: 1.025 (ref 1.005–1.030)
Urobilinogen, UA: 0.2 mg/dL (ref 0.0–1.0)

## 2014-06-02 LAB — WET PREP, GENITAL
TRICH WET PREP: NONE SEEN
Yeast Wet Prep HPF POC: NONE SEEN

## 2014-06-02 LAB — POCT PREGNANCY, URINE: Preg Test, Ur: NEGATIVE

## 2014-06-02 MED ORDER — PHENAZOPYRIDINE HCL 100 MG PO TABS: 200.0000 mg | ORAL_TABLET | Freq: Once | ORAL | Status: DC

## 2014-06-02 MED ORDER — KETOROLAC TROMETHAMINE 60 MG/2ML IM SOLN
60.0000 mg | Freq: Once | INTRAMUSCULAR | Status: AC
  Administered 2014-06-02: 60 mg via INTRAMUSCULAR
  Filled 2014-06-02: qty 2

## 2014-06-02 NOTE — Discharge Instructions (Signed)
Pelvic Pain Pelvic pain is pain felt below the belly button and between your hips. It can be caused by many different things. It is important to get help right away. This is especially true for severe, sharp, or unusual pain that comes on suddenly.  HOME CARE  Only take medicine as told by your doctor.  Rest as told by your doctor.  Eat a healthy diet, such as fruits, vegetables, and lean meats.  Drink enough fluids to keep your pee (urine) clear or pale yellow, or as told.  Avoid sex (intercourse) if it causes pain.  Apply warm or cold packs to your lower belly (abdomen). Use the type of pack that helps the pain.  Avoid situations that cause you stress.  Keep a journal to track your pain. Write down:  When the pain started.  Where it is located.  If there are things that seem to be related to the pain, such as food or your period.  Follow up with your doctor as told. GET HELP RIGHT AWAY IF:   You have heavy bleeding from the vagina.  You have more pelvic pain.  You feel lightheaded or pass out (faint).  You have chills.  You have pain when you pee or have blood in your pee.  You cannot stop having watery poop (diarrhea).  You cannot stop throwing up (vomiting).  You have a fever or lasting symptoms for more than 3 days.  You have a fever and your symptoms suddenly get worse.  You are being physically or sexually abused.  Your medicine does not help your pain.  You have fluid (discharge) coming from your vagina that is not normal. MAKE SURE YOU:  Understand these instructions.  Will watch your condition.  Will get help if you are not doing well or get worse. Document Released: 03/12/2008 Document Revised: 03/25/2012 Document Reviewed: 01/14/2012 Exit[employer] Patient Information 2015 [employer]. This information is not intended to replace advice given to you by your health care provider. Make sure you discuss any questions you have with your health care  provider.  

## 2014-06-02 NOTE — MAU Note (Signed)
Pt states that she began to have severe pelvic pain while at work this evening. The pain started at 1800 and became increasingly more uncomfortable. Pt. States that she also has the pain shooting down into her leg. Pt states that she has not experienced this kind of pain before.

## 2014-06-02 NOTE — MAU Provider Note (Signed)
History     CSN: [id number]  Arrival date and time: 06/02/14 4098   First Provider Initiated Contact with Patient 06/02/14 [phone]      Chief Complaint  Patient presents with  . Abdominal Pain   Abdominal Pain    Meredith Reeves is a 33 y.o. a 33 y.o. G1P0101 who presents today with pelvic pain and back pain. She states that she was having some pain yesterday, but overnight it became much worse. She denies any vaginal bleeding. She states that she uses depo for birth control, and does not have a period. She states that she has been peeing more frequently. She has not taken anything for the pain at this time.   Past Medical History  Diagnosis Date  . GERD (gastroesophageal reflux disease)   . Migraines   . Cough   . Trouble swallowing   . Staph infection   . Fibromyalgia     Past Surgical History  Procedure Laterality Date  . Cesarean section  1996  . Liposection  2010  . Breast reduction  2005    Family History  Problem Relation Age of Onset  . Anesthesia problems Neg Hx   . Alcohol abuse Father   . Heart disease Father   . Hyperlipidemia Father     History  Substance Use Topics  . Smoking status: Never Smoker   . Smokeless tobacco: Never Used  . Alcohol Use: Yes     Comment: once a week    Allergies: No Known Allergies  Prescriptions prior to admission  Medication Sig Dispense Refill  . cyclobenzaprine (FLEXERIL) 10 MG tablet Take 10 mg by mouth 4 (four) times daily as needed for muscle spasms.      . diazepam (VALIUM) 5 MG tablet Take 1 tablet (5 mg total) by mouth 2 (two) times daily.  10 tablet  0  . gabapentin (NEURONTIN) 100 MG capsule Take 100 mg by mouth 3 (three) times daily as needed (pain).      . naproxen (NAPROSYN) 500 MG tablet Take 1 tablet (500 mg total) by mouth 2 (two) times daily.  30 tablet  0  . oxyCODONE-acetaminophen (PERCOCET/ROXICET) 5-325 MG per tablet Take 1-2 tablets by mouth every 4 (four) hours as needed for severe pain.  20  tablet  0    Review of Systems  Gastrointestinal: Positive for abdominal pain.   Physical Exam   Blood pressure 127/81, pulse 73, temperature 98.3 F (36.8 C), temperature source Oral, resp. rate 18.  Physical Exam  Nursing note and vitals reviewed. Constitutional: She is oriented to person, place, and time. She appears well-developed and well-nourished. No distress.  Cardiovascular: Normal rate.   Respiratory: Effort normal.  GI: Soft. There is tenderness (slightly tender just above the pubic bone, and patient states when I push there is feels like she needs to void. ). There is no rebound.  Genitourinary:   External: no lesion Vagina: small amount of thin, white discharge Cervix: pink, smooth, no CMT Uterus: NSSC Adnexa: NT   Neurological: She is alert and oriented to person, place, and time.  Skin: Skin is warm and dry.  Psychiatric: She has a normal mood and affect.    MAU Course  Procedures Results for orders placed during the hospital encounter of 06/02/14 (from the past 24 hour(s))  URINALYSIS, ROUTINE W REFLEX MICROSCOPIC     Status: None   Collection Time    06/02/14  3:19 AM      Result Value Ref  Range   Color, Urine YELLOW  YELLOW   APPearance CLEAR  CLEAR   Specific Gravity, Urine 1.025  1.005 - 1.030   pH 6.0  5.0 - 8.0   Glucose, UA NEGATIVE  NEGATIVE mg/dL   Hgb urine dipstick NEGATIVE  NEGATIVE   Bilirubin Urine NEGATIVE  NEGATIVE   Ketones, ur NEGATIVE  NEGATIVE mg/dL   Protein, ur NEGATIVE  NEGATIVE mg/dL   Urobilinogen, UA 0.2  0.0 - 1.0 mg/dL   Nitrite NEGATIVE  NEGATIVE   Leukocytes, UA NEGATIVE  NEGATIVE  POCT PREGNANCY, URINE     Status: None   Collection Time    06/02/14  3:30 AM      Result Value Ref Range   Preg Test, Ur NEGATIVE  NEGATIVE  WET PREP, GENITAL     Status: Abnormal   Collection Time    06/02/14  4:50 AM      Result Value Ref Range   Yeast Wet Prep HPF POC NONE SEEN  NONE SEEN   Trich, Wet Prep NONE SEEN  NONE SEEN    Clue Cells Wet Prep HPF POC FEW (*) NONE SEEN   WBC, Wet Prep HPF POC FEW (*) NONE SEEN     Assessment and Plan   1. Pelvic pain in female    UC pending Patient refuses pyridium at this time Discussed comfort measures Return to MAU as needed  Follow-up Information   Follow up with Kathreen Cosier, MD. (If symptoms worsen)    Specialty:  Obstetrics and Gynecology   Contact information:   [address] [place] [address] [phone]         Tawnya Crook 06/02/2014, 4:57 AM

## 2014-06-03 LAB — URINE CULTURE: Colony Count: 40000

## 2014-06-03 LAB — GC/CHLAMYDIA PROBE AMP
CT PROBE, AMP APTIMA: NEGATIVE
GC Probe RNA: NEGATIVE

## 2014-08-09 ENCOUNTER — Encounter (HOSPITAL_COMMUNITY): Payer: Self-pay | Admitting: *Deleted

## 2014-10-24 ENCOUNTER — Encounter (HOSPITAL_COMMUNITY): Payer: Self-pay | Admitting: *Deleted

## 2014-10-24 ENCOUNTER — Emergency Department (HOSPITAL_COMMUNITY)
Admission: EM | Admit: 2014-10-24 | Discharge: 2014-10-24 | Disposition: A | Discharge status: Home / Self Care | Payer: BLUE CROSS/BLUE SHIELD | Attending: Emergency Medicine | Admitting: Emergency Medicine

## 2014-10-24 DIAGNOSIS — Z3202 Encounter for pregnancy test, result negative: Secondary | ICD-10-CM | POA: Diagnosis not present

## 2014-10-24 DIAGNOSIS — Z8619 Personal history of other infectious and parasitic diseases: Secondary | ICD-10-CM | POA: Diagnosis not present

## 2014-10-24 DIAGNOSIS — G43909 Migraine, unspecified, not intractable, without status migrainosus: Secondary | ICD-10-CM | POA: Insufficient documentation

## 2014-10-24 DIAGNOSIS — N390 Urinary tract infection, site not specified: Secondary | ICD-10-CM

## 2014-10-24 DIAGNOSIS — M797 Fibromyalgia: Secondary | ICD-10-CM | POA: Diagnosis not present

## 2014-10-24 DIAGNOSIS — N898 Other specified noninflammatory disorders of vagina: Secondary | ICD-10-CM | POA: Insufficient documentation

## 2014-10-24 DIAGNOSIS — M549 Dorsalgia, unspecified: Secondary | ICD-10-CM | POA: Diagnosis present

## 2014-10-24 DIAGNOSIS — N72 Inflammatory disease of cervix uteri: Secondary | ICD-10-CM | POA: Diagnosis not present

## 2014-10-24 DIAGNOSIS — Z791 Long term (current) use of non-steroidal anti-inflammatories (NSAID): Secondary | ICD-10-CM | POA: Diagnosis not present

## 2014-10-24 DIAGNOSIS — Z8719 Personal history of other diseases of the digestive system: Secondary | ICD-10-CM | POA: Insufficient documentation

## 2014-10-24 DIAGNOSIS — Z79899 Other long term (current) drug therapy: Secondary | ICD-10-CM | POA: Insufficient documentation

## 2014-10-24 DIAGNOSIS — R109 Unspecified abdominal pain: Secondary | ICD-10-CM

## 2014-10-24 DIAGNOSIS — R10A1 Flank pain, right side: Secondary | ICD-10-CM

## 2014-10-24 LAB — URINALYSIS, ROUTINE W REFLEX MICROSCOPIC
Bilirubin Urine: NEGATIVE
Glucose, UA: NEGATIVE mg/dL
HGB URINE DIPSTICK: NEGATIVE
KETONES UR: NEGATIVE mg/dL
Nitrite: NEGATIVE
PH: 6.5 (ref 5.0–8.0)
Protein, ur: NEGATIVE mg/dL
SPECIFIC GRAVITY, URINE: 1.026 (ref 1.005–1.030)
Urobilinogen, UA: 1 mg/dL (ref 0.0–1.0)

## 2014-10-24 LAB — CBC WITH DIFFERENTIAL/PLATELET
BASOS ABS: 0 10*3/uL (ref 0.0–0.1)
BASOS PCT: 0 % (ref 0–1)
Eosinophils Absolute: 0 10*3/uL (ref 0.0–0.7)
Eosinophils Relative: 1 % (ref 0–5)
HEMATOCRIT: 39 % (ref 36.0–46.0)
Hemoglobin: 12.3 g/dL (ref 12.0–15.0)
LYMPHS PCT: 62 % — AB (ref 12–46)
Lymphs Abs: 3 10*3/uL (ref 0.7–4.0)
MCH: 22.4 pg — ABNORMAL LOW (ref 26.0–34.0)
MCHC: 31.5 g/dL (ref 30.0–36.0)
MCV: 71 fL — AB (ref 78.0–100.0)
MONOS PCT: 5 % (ref 3–12)
Monocytes Absolute: 0.3 10*3/uL (ref 0.1–1.0)
NEUTROS PCT: 32 % — AB (ref 43–77)
Neutro Abs: 1.5 10*3/uL — ABNORMAL LOW (ref 1.7–7.7)
Platelets: 244 10*3/uL (ref 150–400)
RBC: 5.49 MIL/uL — AB (ref 3.87–5.11)
RDW: 14.1 % (ref 11.5–15.5)
WBC: 4.8 10*3/uL (ref 4.0–10.5)

## 2014-10-24 LAB — URINE MICROSCOPIC-ADD ON

## 2014-10-24 LAB — COMPREHENSIVE METABOLIC PANEL
ALT: 23 U/L (ref 0–35)
AST: 22 U/L (ref 0–37)
Albumin: 3.7 g/dL (ref 3.5–5.2)
Alkaline Phosphatase: 81 U/L (ref 39–117)
Anion gap: 7 (ref 5–15)
BUN: 7 mg/dL (ref 6–23)
CALCIUM: 8.9 mg/dL (ref 8.4–10.5)
CHLORIDE: 107 meq/L (ref 96–112)
CO2: 23 mmol/L (ref 19–32)
Creatinine, Ser: 0.66 mg/dL (ref 0.50–1.10)
GFR calc Af Amer: >90 mL/min (ref >90)
GFR calc non Af Amer: >90 mL/min (ref >90)
Glucose, Bld: 104 mg/dL — ABNORMAL HIGH (ref 70–99)
Potassium: 3.8 mmol/L (ref 3.5–5.1)
Sodium: 137 mmol/L (ref 135–145)
Total Bilirubin: 0.9 mg/dL (ref 0.3–1.2)
Total Protein: 7.6 g/dL (ref 6.0–8.3)

## 2014-10-24 LAB — WET PREP, GENITAL
Trich, Wet Prep: NONE SEEN
Yeast Wet Prep HPF POC: NONE SEEN

## 2014-10-24 LAB — POC URINE PREG, ED: Preg Test, Ur: NEGATIVE

## 2014-10-24 MED ORDER — AZITHROMYCIN 250 MG PO TABS
1000.0000 mg | ORAL_TABLET | Freq: Once | ORAL | Status: DC
Start: 2014-10-24 — End: 2014-10-24
  Filled 2014-10-24 (×2): qty 4

## 2014-10-24 MED ORDER — AZITHROMYCIN 250 MG PO TABS
1000.0000 mg | ORAL_TABLET | Freq: Once | ORAL | Status: AC
  Administered 2014-10-24: 1000 mg via ORAL

## 2014-10-24 MED ORDER — OXYCODONE-ACETAMINOPHEN 5-325 MG PO TABS: 1.0000 | ORAL_TABLET | Freq: Four times a day (QID) | ORAL | Status: DC | PRN

## 2014-10-24 MED ORDER — OXYCODONE-ACETAMINOPHEN 5-325 MG PO TABS
2.0000 | ORAL_TABLET | Freq: Once | ORAL | Status: AC
Start: 2014-10-24 — End: 2014-10-24
  Administered 2014-10-24: 2 via ORAL
  Filled 2014-10-24: qty 2

## 2014-10-24 MED ORDER — PROMETHAZINE HCL 25 MG PO TABS
25.0000 mg | ORAL_TABLET | Freq: Once | ORAL | Status: AC
  Administered 2014-10-24: 25 mg via ORAL
  Filled 2014-10-24: qty 1

## 2014-10-24 MED ORDER — CEFTRIAXONE SODIUM 250 MG IJ SOLR
250.0000 mg | Freq: Once | INTRAMUSCULAR | Status: AC
  Administered 2014-10-24: 250 mg via INTRAMUSCULAR
  Filled 2014-10-24: qty 250

## 2014-10-24 MED ORDER — ONDANSETRON HCL 4 MG PO TABS: 4.0000 mg | ORAL_TABLET | Freq: Four times a day (QID) | ORAL | Status: DC

## 2014-10-24 MED ORDER — LIDOCAINE HCL (PF) 1 % IJ SOLN
5.0000 mL | Freq: Once | INTRAMUSCULAR | Status: AC
  Administered 2014-10-24: 0.9 mL
  Filled 2014-10-24 (×2): qty 5

## 2014-10-24 MED ORDER — METRONIDAZOLE 500 MG PO TABS: 500.0000 mg | ORAL_TABLET | Freq: Two times a day (BID) | ORAL | Status: DC

## 2014-10-24 MED ORDER — CEPHALEXIN 500 MG PO CAPS: 500.0000 mg | ORAL_CAPSULE | Freq: Two times a day (BID) | ORAL | Status: DC

## 2014-10-24 MED ORDER — LIDOCAINE HCL (PF) 1 % IJ SOLN: 0.9000 mL | Freq: Once | INTRAMUSCULAR | Status: AC

## 2014-10-24 NOTE — ED Notes (Signed)
Reported to Dr. Loretha StaplerWofford that pt. s pain level is a 4/10 and she would like medication to go home with.

## 2014-10-24 NOTE — Discharge Instructions (Signed)
Flank Pain °Flank pain refers to pain that is located on the side of the body between the upper abdomen and the back. The pain may occur over a short period of time (acute) or may be long-term or reoccurring (chronic). It may be mild or severe. Flank pain can be caused by many things. °CAUSES  °Some of the more common causes of flank pain include: °· Muscle strains.   °· Muscle spasms.   °· A disease of your spine (vertebral disk disease).   °· A lung infection (pneumonia).   °· Fluid around your lungs (pulmonary edema).   °· A kidney infection.   °· Kidney stones.   °· A very painful skin rash caused by the chickenpox virus (shingles).   °· Gallbladder disease.   °HOME CARE INSTRUCTIONS  °Home care will depend on the cause of your pain. In general, °· Rest as directed by your caregiver. °· Drink enough fluids to keep your urine clear or pale yellow. °· Only take over-the-counter or prescription medicines as directed by your caregiver. Some medicines may help relieve the pain. °· Tell your caregiver about any changes in your pain. °· Follow up with your caregiver as directed. °SEEK IMMEDIATE MEDICAL CARE IF:  °· Your pain is not controlled with medicine.   °· You have new or worsening symptoms. °· Your pain increases.   °· You have abdominal pain.   °· You have shortness of breath.   °· You have persistent nausea or vomiting.   °· You have swelling in your abdomen.   °· You feel faint or pass out.   °· You have blood in your urine. °· You have a fever or persistent symptoms for more than 2-3 days. °· You have a fever and your symptoms suddenly get worse. °MAKE SURE YOU:  °· Understand these instructions. °· Will watch your condition. °· Will get help right away if you are not doing well or get worse. °Document Released: 11/15/2005 Document Revised: 06/18/2012 Document Reviewed: 05/08/2012 °ExitCare® Patient Information ©2015 [employer]. This information is not intended to replace advice given to you by your  health care provider. Make sure you discuss any questions you have with your health care provider. ° °Urinary Tract Infection °Urinary tract infections (UTIs) can develop anywhere along your urinary tract. Your urinary tract is your body's drainage system for removing wastes and extra water. Your urinary tract includes two kidneys, two ureters, a bladder, and a urethra. Your kidneys are a pair of bean-shaped organs. Each kidney is about the size of your fist. They are located below your ribs, one on each side of your spine. °CAUSES °Infections are caused by microbes, which are microscopic organisms, including fungi, viruses, and bacteria. These organisms are so small that they can only be seen through a microscope. Bacteria are the microbes that most commonly cause UTIs. °SYMPTOMS  °Symptoms of UTIs may vary by age and gender of the patient and by the location of the infection. Symptoms in young women typically include a frequent and intense urge to urinate and a painful, burning feeling in the bladder or urethra during urination. Older women and men are more likely to be tired, shaky, and weak and have muscle aches and abdominal pain. A fever may mean the infection is in your kidneys. Other symptoms of a kidney infection include pain in your back or sides below the ribs, nausea, and vomiting. °DIAGNOSIS °To diagnose a UTI, your caregiver will ask you about your symptoms. Your caregiver also will ask to provide a urine sample. The urine sample will be tested for   bacteria and white blood cells. White blood cells are made by your body to help fight infection. TREATMENT  Typically, UTIs can be treated with medication. Because most UTIs are caused by a bacterial infection, they usually can be treated with the use of antibiotics. The choice of antibiotic and length of treatment depend on your symptoms and the type of bacteria causing your infection. HOME CARE INSTRUCTIONS  If you were prescribed antibiotics, take  them exactly as your caregiver instructs you. Finish the medication even if you feel better after you have only taken some of the medication.  Drink enough water and fluids to keep your urine clear or pale yellow.  Avoid caffeine, tea, and carbonated beverages. They tend to irritate your bladder.  Empty your bladder often. Avoid holding urine for long periods of time.  Empty your bladder before and after sexual intercourse.  After a bowel movement, women should cleanse from front to back. Use each tissue only once. SEEK MEDICAL CARE IF:   You have back pain.  You develop a fever.  Your symptoms do not begin to resolve within 3 days. SEEK IMMEDIATE MEDICAL CARE IF:   You have severe back pain or lower abdominal pain.  You develop chills.  You have nausea or vomiting.  You have continued burning or discomfort with urination. MAKE SURE YOU:   Understand these instructions.  Will watch your condition.  Will get help right away if you are not doing well or get worse. Document Released: 07/04/2005 Document Revised: 03/25/2012 Document Reviewed: 11/02/2011 [facility] Patient Information 2015 [place], [place]. This information is not intended to replace advice given to you by your health care provider. Make sure you discuss any questions you have with your health care provider.  Cervicitis Cervicitis is a soreness and swelling (inflammation) of the cervix. Your cervix is located at the bottom of your uterus. It opens up to the vagina. CAUSES   Sexually transmitted infections (STIs).   Allergic reaction.   Medicines or birth control devices that are put in the vagina.   Injury to the cervix.   Bacterial infections.  RISK FACTORS You are at greater risk if you:  Have unprotected sexual intercourse.  Have sexual intercourse with many partners.  Began sexual intercourse at an early age.  Have a history of STIs. SYMPTOMS  There may be no symptoms. If symptoms occur,  they may include:   Gray, white, yellow, or bad-smelling vaginal discharge.   Pain or itching of the area outside the vagina.   Painful sexual intercourse.   Lower abdominal or lower back pain, especially during intercourse.   Frequent urination.   Abnormal vaginal bleeding between periods, after sexual intercourse, or after menopause.   Pressure or a heavy feeling in the pelvis.  DIAGNOSIS  Diagnosis is made after a pelvic exam. Other tests may include:   Examination of any discharge under a microscope (wet prep).   A Pap test.  TREATMENT  Treatment will depend on the cause of cervicitis. If it is caused by an STI, both you and your partner will need to be treated. Antibiotic medicines will be given.  HOME CARE INSTRUCTIONS   Do not have sexual intercourse until your health care provider says it is okay.   Do not have sexual intercourse until your partner has been treated, if your cervicitis is caused by an STI.   Take your antibiotics as directed. Finish them even if you start to feel better.  SEEK MEDICAL CARE IF:  Your symptoms come back.   You have a fever.  MAKE SURE YOU:   Understand these instructions.  Will watch your condition.  Will get help right away if you are not doing well or get worse. Document Released: 09/24/2005 Document Revised: 09/29/2013 Document Reviewed: 03/18/2013 [facility] Patient Information 2015 [place], [place]. This information is not intended to replace advice given to you by your health care provider. Make sure you discuss any questions you have with your health care provider.

## 2014-10-24 NOTE — ED Provider Notes (Signed)
CSN: [id number]     Arrival date & time 10/24/14  0815 History   First MD Initiated Contact with Patient 10/24/14 [phone]     Chief Complaint  Patient presents with  . Back Pain     (Consider location/radiation/quality/duration/timing/severity/associated sxs/prior Treatment) Patient is a 34 y.o. female presenting with flank pain.  Flank Pain This is a new (right sided) problem. Episode onset: a few hours ago. The problem occurs constantly. The problem has not changed since onset.Pertinent negatives include no chest pain, no abdominal pain and no shortness of breath. Associated symptoms comments: Cough. Urinary frequency, urgency, and odor. Vaginal bleeding and vaginal discharge last week, since improved.. Exacerbated by: Moving, twisting, turning. Nothing relieves the symptoms. She has tried nothing for the symptoms.    Past Medical History  Diagnosis Date  . GERD (gastroesophageal reflux disease)   . Migraines   . Cough   . Trouble swallowing   . Staph infection   . Fibromyalgia    Past Surgical History  Procedure Laterality Date  . Cesarean section  1996  . Liposection  2010  . Breast reduction  2005   Family History  Problem Relation Age of Onset  . Anesthesia problems Neg Hx   . Alcohol abuse Father   . Heart disease Father   . Hyperlipidemia Father    History  Substance Use Topics  . Smoking status: Never Smoker   . Smokeless tobacco: Never Used  . Alcohol Use: Yes     Comment: once a week   OB History    Gravida Para Term Preterm AB TAB SAB Ectopic Multiple Living   Review of Systems  Respiratory: Negative for shortness of breath.   Cardiovascular: Negative for chest pain.  Gastrointestinal: Negative for abdominal pain.  Genitourinary: Positive for flank pain.  All other systems reviewed and are negative.     Allergies  Review of patient's allergies indicates no known allergies.  Home Medications   Prior to Admission medications    Medication Sig Start Date End Date Taking? Authorizing Provider  cyclobenzaprine (FLEXERIL) 10 MG tablet Take 10 mg by mouth 4 (four) times daily as needed for muscle spasms.    Historical Provider, MD  diazepam (VALIUM) 5 MG tablet Take 1 tablet (5 mg total) by mouth 2 (two) times daily. 11/24/13   Junius Finner, PA-C  gabapentin (NEURONTIN) 100 MG capsule Take 100 mg by mouth 3 (three) times daily as needed (pain).    Historical Provider, MD  naproxen (NAPROSYN) 500 MG tablet Take 1 tablet (500 mg total) by mouth 2 (two) times daily. 01/30/14   Arthor Captain, PA-C  oxyCODONE-acetaminophen (PERCOCET/ROXICET) 5-325 MG per tablet Take 1-2 tablets by mouth every 4 (four) hours as needed for severe pain. 01/30/14   Abigail Harris, PA-C   BP 116/74 mmHg  Pulse 94  Temp(Src) 98 F (36.7 C) (Oral)  Resp 18  SpO2 100% Physical Exam  Constitutional: She is oriented to person, place, and time. She appears well-developed and well-nourished. No distress.  HENT:  Head: Normocephalic and atraumatic.  Mouth/Throat: Oropharynx is clear and moist.  Eyes: Conjunctivae are normal. Pupils are equal, round, and reactive to light. No scleral icterus.  Neck: Neck supple.  Cardiovascular: Normal rate, regular rhythm, normal heart sounds and intact distal pulses.   No murmur heard. Pulmonary/Chest: Effort normal and breath sounds normal. No stridor. No respiratory distress. She has no rales.  Abdominal: Soft. Bowel sounds are normal. She exhibits no distension. There is no tenderness. There is CVA tenderness (Right). There is no rigidity, no rebound and no guarding.  Genitourinary: There is no lesion or injury on the right labia. There is no lesion or injury on the left labia. Uterus is tender. Uterus is not enlarged. Cervix exhibits motion tenderness, discharge and friability. Right adnexum displays tenderness. Right adnexum displays no mass. Left adnexum displays tenderness. Left adnexum displays no mass.  Mild  nonfocal pelvic tenderness.  White vaginal discharge.  Yellow cervical discharge.  Musculoskeletal: Normal range of motion.  Neurological: She is alert and oriented to person, place, and time.  Skin: Skin is warm and dry. No rash noted.  Psychiatric: She has a normal mood and affect. Her behavior is normal.  Nursing note and vitals reviewed.   ED Course  Procedures (including critical care time) Labs Review Labs Reviewed  WET PREP, GENITAL - Abnormal; Notable for the following:    Clue Cells Wet Prep HPF POC MODERATE (*)    WBC, Wet Prep HPF POC FEW (*)    All other components within normal limits  URINALYSIS, ROUTINE W REFLEX MICROSCOPIC - Abnormal; Notable for the following:    APPearance CLOUDY (*)    Leukocytes, UA SMALL (*)    All other components within normal limits  URINE MICROSCOPIC-ADD ON - Abnormal; Notable for the following:    Squamous Epithelial / LPF FEW (*)    Bacteria, UA FEW (*)    All other components within normal limits  CBC WITH DIFFERENTIAL - Abnormal; Notable for the following:    RBC 5.49 (*)    MCV 71.0 (*)    MCH 22.4 (*)    Neutrophils Relative % 32 (*)    Neutro Abs 1.5 (*)    Lymphocytes Relative 62 (*)    All other components within normal limits  COMPREHENSIVE METABOLIC PANEL - Abnormal; Notable for the following:    Glucose, Bld 104 (*)    All other components within normal limits  RPR  POC URINE PREG, ED  GC/CHLAMYDIA PROBE AMP ([place])    Imaging Review No results found.   EKG Interpretation None      MDM   Final diagnoses:  Right flank pain  Cervicitis  Urinary tract infection without hematuria, site unspecified    34 year old female with right flank pain. She strained her back a few weeks ago, which has flared up a few times since then. However, last night, the pain was worse and felt different. She is also had urinary urgency, frequency, and urinary odor for past several days. UA does not show blood in clinical picture  less likely represent kidney stones. UA is borderline for infection. We'll send for culture. However, since she has significant urinary symptoms, will treat for UTI. She also has cervical friability and discharge on exam. She endorses unprotected sex. Will treat presumptively for GC and chlamydia. Have also sent RPR. Despite my efforts to convince her, she refused and did not consent to HIV testing. We'll discharge with Flagyl for BV, Keflex for UTI, as well as nausea and pain medicine. Last follow-up. Given return precautions.    Candyce ChurnJohn David Shanon Becvar III, MD 10/24/14 [phone]

## 2014-10-24 NOTE — ED Notes (Signed)
Pt. Stated, "I cannot take Vicodin , it makes me nauseated."  The tramadol and toradol does nothing for me, the only thing that helps me is Percocet."

## 2014-10-24 NOTE — ED Notes (Signed)
Pt reports pulling a muscle exercising 3 weeks ago, now has increase in pain to right lower back. Also recent vaginal bleeding and odor.

## 2014-10-25 LAB — GC/CHLAMYDIA PROBE AMP (~~LOC~~) NOT AT ARMC
CHLAMYDIA, DNA PROBE: NEGATIVE
NEISSERIA GONORRHEA: NEGATIVE

## 2014-11-01 LAB — RPR: RPR Ser Ql: NONREACTIVE

## 2015-03-28 ENCOUNTER — Encounter (HOSPITAL_COMMUNITY): Payer: Self-pay | Admitting: *Deleted

## 2015-03-28 ENCOUNTER — Inpatient Hospital Stay (HOSPITAL_COMMUNITY)
Admission: AD | Admit: 2015-03-28 | Discharge: 2015-03-28 | Disposition: A | Discharge status: Home / Self Care | Payer: BLUE CROSS/BLUE SHIELD | Source: Ambulatory Visit | Attending: Obstetrics | Admitting: Obstetrics

## 2015-03-28 DIAGNOSIS — R109 Unspecified abdominal pain: Secondary | ICD-10-CM | POA: Diagnosis not present

## 2015-03-28 DIAGNOSIS — R1030 Lower abdominal pain, unspecified: Secondary | ICD-10-CM | POA: Diagnosis not present

## 2015-03-28 DIAGNOSIS — Z202 Contact with and (suspected) exposure to infections with a predominantly sexual mode of transmission: Secondary | ICD-10-CM | POA: Insufficient documentation

## 2015-03-28 LAB — URINALYSIS, ROUTINE W REFLEX MICROSCOPIC
Bilirubin Urine: NEGATIVE
GLUCOSE, UA: NEGATIVE mg/dL
HGB URINE DIPSTICK: NEGATIVE
Ketones, ur: NEGATIVE mg/dL
LEUKOCYTES UA: NEGATIVE
Nitrite: NEGATIVE
Protein, ur: 30 mg/dL — AB
Specific Gravity, Urine: >1.03 — ABNORMAL HIGH (ref 1.005–1.030)
Urobilinogen, UA: 0.2 mg/dL (ref 0.0–1.0)
pH: 6 (ref 5.0–8.0)

## 2015-03-28 LAB — POCT PREGNANCY, URINE: Preg Test, Ur: NEGATIVE

## 2015-03-28 LAB — URINE MICROSCOPIC-ADD ON

## 2015-03-28 LAB — WET PREP, GENITAL
Clue Cells Wet Prep HPF POC: NONE SEEN
TRICH WET PREP: NONE SEEN
Yeast Wet Prep HPF POC: NONE SEEN

## 2015-03-28 NOTE — Discharge Instructions (Signed)
Abdominal Pain, Women °Abdominal (stomach, pelvic, or belly) pain can be caused by many things. It is important to tell your doctor: °· The location of the pain. °· Does it come and go or is it present all the time? °· Are there things that start the pain (eating certain foods, exercise)? °· Are there other symptoms associated with the pain (fever, nausea, vomiting, diarrhea)? °All of this is helpful to know when trying to find the cause of the pain. °CAUSES  °· Stomach: virus or bacteria infection, or ulcer. °· Intestine: appendicitis (inflamed appendix), regional ileitis (Crohn's disease), ulcerative colitis (inflamed colon), irritable bowel syndrome, diverticulitis (inflamed diverticulum of the colon), or cancer of the stomach or intestine. °· Gallbladder disease or stones in the gallbladder. °· Kidney disease, kidney stones, or infection. °· Pancreas infection or cancer. °· Fibromyalgia (pain disorder). °· Diseases of the female organs: °¨ Uterus: fibroid (non-cancerous) tumors or infection. °¨ Fallopian tubes: infection or tubal pregnancy. °¨ Ovary: cysts or tumors. °¨ Pelvic adhesions (scar tissue). °¨ Endometriosis (uterus lining tissue growing in the pelvis and on the pelvic organs). °¨ Pelvic congestion syndrome (female organs filling up with blood just before the menstrual period). °¨ Pain with the menstrual period. °¨ Pain with ovulation (producing an egg). °¨ Pain with an IUD (intrauterine device, birth control) in the uterus. °¨ Cancer of the female organs. °· Functional pain (pain not caused by a disease, may improve without treatment). °· Psychological pain. °· Depression. °DIAGNOSIS  °Your doctor will decide the seriousness of your pain by doing an examination. °· Blood tests. °· X-rays. °· Ultrasound. °· CT scan (computed tomography, special type of X-ray). °· MRI (magnetic resonance imaging). °· Cultures, for infection. °· Barium enema (dye inserted in the large intestine, to better view it with  X-rays). °· Colonoscopy (looking in intestine with a lighted tube). °· Laparoscopy (minor surgery, looking in abdomen with a lighted tube). °· Major abdominal exploratory surgery (looking in abdomen with a large incision). °TREATMENT  °The treatment will depend on the cause of the pain.  °· Many cases can be observed and treated at home. °· Over-the-counter medicines recommended by your caregiver. °· Prescription medicine. °· Antibiotics, for infection. °· Birth control pills, for painful periods or for ovulation pain. °· Hormone treatment, for endometriosis. °· Nerve blocking injections. °· Physical therapy. °· Antidepressants. °· Counseling with a psychologist or psychiatrist. °· Minor or major surgery. °HOME CARE INSTRUCTIONS  °· Do not take laxatives, unless directed by your caregiver. °· Take over-the-counter pain medicine only if ordered by your caregiver. Do not take aspirin because it can cause an upset stomach or bleeding. °· Try a clear liquid diet (broth or water) as ordered by your caregiver. Slowly move to a bland diet, as tolerated, if the pain is related to the stomach or intestine. °· Have a thermometer and take your temperature several times a day, and record it. °· Bed rest and sleep, if it helps the pain. °· Avoid sexual intercourse, if it causes pain. °· Avoid stressful situations. °· Keep your follow-up appointments and tests, as your caregiver orders. °· If the pain does not go away with medicine or surgery, you may try: °¨ Acupuncture. °¨ Relaxation exercises (yoga, meditation). °¨ Group therapy. °¨ Counseling. °SEEK MEDICAL CARE IF:  °· You notice certain foods cause stomach pain. °· Your home care treatment is not helping your pain. °· You need stronger pain medicine. °· You want your IUD removed. °· You feel faint or   lightheaded. °· You develop nausea and vomiting. °· You develop a rash. °· You are having side effects or an allergy to your medicine. °SEEK IMMEDIATE MEDICAL CARE IF:  °· Your  pain does not go away or gets worse. °· You have a fever. °· Your pain is felt only in portions of the abdomen. The right side could possibly be appendicitis. The left lower portion of the abdomen could be colitis or diverticulitis. °· You are passing blood in your stools (bright red or black tarry stools, with or without vomiting). °· You have blood in your urine. °· You develop chills, with or without a fever. °· You pass out. °MAKE SURE YOU:  °· Understand these instructions. °· Will watch your condition. °· Will get help right away if you are not doing well or get worse. °Document Released: 07/22/2007 Document Revised: 02/08/2014 Document Reviewed: 08/11/2009 °ExitCare® Patient Information ©2015 [employer]. This information is not intended to replace advice given to you by your health care provider. Make sure you discuss any questions you have with your health care provider. ° °

## 2015-03-28 NOTE — MAU Provider Note (Signed)
History     CSN: [id number]  Arrival date and time: 03/28/15 1730   None     Chief Complaint  Patient presents with  . Abdominal Cramping   HPI Patient is 34 y.o. G1P1 here with lower abdominal pain.  Lower abdominal pain: radiates down to knees.  Has occurred previously, reports "told me my cervix was tilted or something".  Pain currently 3/10, at worse 7/10.  Has not taken anything for pain, x2days.  Worse with at night, nothing improves.  Has not taken any medications for pain  Recently had abdominoplasty on April 18th, has postop follow up tomorrow  Past Medical History  Diagnosis Date  . GERD (gastroesophageal reflux disease)   . Migraines   . Cough   . Trouble swallowing   . Staph infection   . Fibromyalgia     Past Surgical History  Procedure Laterality Date  . Cesarean section  1996  . Liposection  2010  . Breast reduction  2005    Family History  Problem Relation Age of Onset  . Anesthesia problems Neg Hx   . Alcohol abuse Father   . Heart disease Father   . Hyperlipidemia Father     History  Substance Use Topics  . Smoking status: Never Smoker   . Smokeless tobacco: Never Used  . Alcohol Use: Yes     Comment: once a week    Allergies:  Allergies  Allergen Reactions  . Vicodin [Hydrocodone-Acetaminophen] Nausea And Vomiting    Prescriptions prior to admission  Medication Sig Dispense Refill Last Dose  . cyclobenzaprine (FLEXERIL) 10 MG tablet Take 10 mg by mouth 4 (four) times daily as needed for muscle spasms.   Past Month at Unknown time  . diazepam (VALIUM) 5 MG tablet Take 1 tablet (5 mg total) by mouth 2 (two) times daily. (Patient taking differently: Take 5 mg by mouth every 12 (twelve) hours as needed for anxiety. ) 10 tablet 0 03/27/2015 at Unknown time  . gabapentin (NEURONTIN) 100 MG capsule Take 100 mg by mouth 3 (three) times daily as needed (pain).   Past Month at Unknown time  . Probiotic Product (PROBIOTIC PO) Take 1 capsule by  mouth daily.   03/27/2015 at Unknown time  . cephALEXin (KEFLEX) 500 MG capsule Take 1 capsule (500 mg total) by mouth 2 (two) times daily. (Patient not taking: Reported on 03/28/2015) 14 capsule 0   . medroxyPROGESTERone (DEPO-PROVERA) 150 MG/ML injection Inject 150 mg into the muscle every 3 (three) months.   4 Past Week at Unknown time  . metroNIDAZOLE (FLAGYL) 500 MG tablet Take 1 tablet (500 mg total) by mouth 2 (two) times daily. (Patient not taking: Reported on 03/28/2015) 14 tablet 0   . naproxen (NAPROSYN) 500 MG tablet Take 1 tablet (500 mg total) by mouth 2 (two) times daily. (Patient not taking: Reported on 10/24/2014) 30 tablet 0   . ondansetron (ZOFRAN) 4 MG tablet Take 1 tablet (4 mg total) by mouth every 6 (six) hours. (Patient not taking: Reported on 03/28/2015) 12 tablet 0   . oxyCODONE-acetaminophen (PERCOCET/ROXICET) 5-325 MG per tablet Take 1-2 tablets by mouth every 6 (six) hours as needed for severe pain. (Patient not taking: Reported on 03/28/2015) 10 tablet 0 Not Taking at Unknown time    Review of Systems  Constitutional: Negative for fever and chills.  HENT: Negative for congestion.   Respiratory: Negative for cough and shortness of breath.   Cardiovascular: Negative for chest pain and leg swelling.  Gastrointestinal: Positive for abdominal pain. Negative for heartburn, nausea, vomiting and diarrhea. Constipation: 1bm daily, usually soft.  Genitourinary: Positive for dysuria (due to current outbreak). Negative for urgency, frequency and hematuria.  Skin: Negative for itching and rash.  Neurological: Positive for headaches (current, mild). Negative for dizziness and loss of consciousness.   Physical Exam   Blood pressure 122/80, pulse 108, temperature 99 F (37.2 C), temperature source Oral, resp. rate 18, height 5' 0.5" (1.537 m), weight 176 lb (79.833 kg).  Physical Exam  Constitutional: She is oriented to person, place, and time. She appears well-developed and  well-nourished.  HENT:  Head: Normocephalic and atraumatic.  Eyes: Conjunctivae and EOM are normal.  Neck: Normal range of motion.  Cardiovascular: Normal rate.   Respiratory: Effort normal. No respiratory distress.  GI: Soft. Bowel sounds are normal. She exhibits no distension and no mass. There is no tenderness (NO). There is no rebound and no guarding.  Long lower transverse scar intact/no erythema/drainage  Musculoskeletal: Normal range of motion. She exhibits no edema.  Neurological: She is alert and oriented to person, place, and time.  Skin: Skin is warm and dry. No erythema.    MAU Course  Procedures  MDM G/C, wet prep  Assessment and Plan  Patient is 34 y.o. G1P1 here with abdominal pain then requesting STD testing - G/C, wet prep - exam VERY rea-sssuring and under whelming.  Only after exam and discussion of normalcy of pain did patient request STD testing.  Has follow up with surgeon tomorrow   Bijal Siglin ROCIO 03/28/2015, 6:03 PM

## 2015-03-28 NOTE — MAU Note (Addendum)
Having bad abd cramps.  Woke her out of her sleep last night.  Started on Sat, but became really intense today.  Had abdominoplasty on 4/18, f/u appt tomorrow with surgeon, does not believe it is from that. Last night it went from mid abd down to her knees

## 2015-03-29 LAB — GC/CHLAMYDIA PROBE AMP (~~LOC~~) NOT AT ARMC
Chlamydia: NEGATIVE
Neisseria Gonorrhea: NEGATIVE

## 2015-04-06 LAB — PROCEDURE REPORT - SCANNED: PAP SMEAR: NEGATIVE

## 2015-07-04 ENCOUNTER — Encounter (HOSPITAL_COMMUNITY): Payer: Self-pay | Admitting: *Deleted

## 2015-07-04 ENCOUNTER — Inpatient Hospital Stay (HOSPITAL_COMMUNITY)
Admission: AD | Admit: 2015-07-04 | Discharge: 2015-07-04 | Disposition: A | Discharge status: Home / Self Care | Payer: Self-pay | Source: Ambulatory Visit | Attending: Obstetrics | Admitting: Obstetrics

## 2015-07-04 DIAGNOSIS — K5909 Other constipation: Secondary | ICD-10-CM

## 2015-07-04 DIAGNOSIS — R350 Frequency of micturition: Secondary | ICD-10-CM | POA: Insufficient documentation

## 2015-07-04 DIAGNOSIS — R109 Unspecified abdominal pain: Secondary | ICD-10-CM

## 2015-07-04 DIAGNOSIS — B373 Candidiasis of vulva and vagina: Secondary | ICD-10-CM | POA: Insufficient documentation

## 2015-07-04 DIAGNOSIS — K5900 Constipation, unspecified: Secondary | ICD-10-CM | POA: Insufficient documentation

## 2015-07-04 DIAGNOSIS — B3731 Acute candidiasis of vulva and vagina: Secondary | ICD-10-CM

## 2015-07-04 HISTORY — DX: Nontoxic single thyroid nodule: E04.1

## 2015-07-04 LAB — WET PREP, GENITAL: TRICH WET PREP: NONE SEEN

## 2015-07-04 LAB — CBC
HEMATOCRIT: 37.5 % (ref 36.0–46.0)
HEMOGLOBIN: 12 g/dL (ref 12.0–15.0)
MCH: 23.1 pg — ABNORMAL LOW (ref 26.0–34.0)
MCHC: 32 g/dL (ref 30.0–36.0)
MCV: 72.3 fL — AB (ref 78.0–100.0)
Platelets: 233 10*3/uL (ref 150–400)
RBC: 5.19 MIL/uL — AB (ref 3.87–5.11)
RDW: 14.9 % (ref 11.5–15.5)
WBC: 6.8 10*3/uL (ref 4.0–10.5)

## 2015-07-04 LAB — COMPREHENSIVE METABOLIC PANEL
ALBUMIN: 4 g/dL (ref 3.5–5.0)
ALK PHOS: 78 U/L (ref 38–126)
ALT: 15 U/L (ref 14–54)
AST: 17 U/L (ref 15–41)
Anion gap: 6 (ref 5–15)
BILIRUBIN TOTAL: 0.3 mg/dL (ref 0.3–1.2)
BUN: 9 mg/dL (ref 6–20)
CO2: 25 mmol/L (ref 22–32)
CREATININE: 0.67 mg/dL (ref 0.44–1.00)
Calcium: 9 mg/dL (ref 8.9–10.3)
Chloride: 107 mmol/L (ref 101–111)
GFR calc Af Amer: >60 mL/min (ref >60)
Glucose, Bld: 97 mg/dL (ref 65–99)
Potassium: 3.9 mmol/L (ref 3.5–5.1)
Sodium: 138 mmol/L (ref 135–145)
TOTAL PROTEIN: 7.6 g/dL (ref 6.5–8.1)

## 2015-07-04 LAB — URINALYSIS, ROUTINE W REFLEX MICROSCOPIC
BILIRUBIN URINE: NEGATIVE
GLUCOSE, UA: NEGATIVE mg/dL
HGB URINE DIPSTICK: NEGATIVE
KETONES UR: NEGATIVE mg/dL
Leukocytes, UA: NEGATIVE
Nitrite: NEGATIVE
PH: 8 (ref 5.0–8.0)
Protein, ur: NEGATIVE mg/dL
Specific Gravity, Urine: 1.015 (ref 1.005–1.030)
Urobilinogen, UA: 0.2 mg/dL (ref 0.0–1.0)

## 2015-07-04 LAB — POCT PREGNANCY, URINE: Preg Test, Ur: NEGATIVE

## 2015-07-04 MED ORDER — POLYETHYLENE GLYCOL 3350 17 G PO PACK: 17.0000 g | PACK | Freq: Every day | ORAL | Status: DC

## 2015-07-04 MED ORDER — FLUCONAZOLE 150 MG PO TABS: 150.0000 mg | ORAL_TABLET | Freq: Once | ORAL | Status: DC

## 2015-07-04 MED ORDER — CALCIUM POLYCARBOPHIL 625 MG PO TABS: 625.0000 mg | ORAL_TABLET | Freq: Every day | ORAL | Status: DC

## 2015-07-04 NOTE — Discharge Instructions (Signed)
Monilial Vaginitis Vaginitis in a soreness, swelling and redness (inflammation) of the vagina and vulva. Monilial vaginitis is not a sexually transmitted infection. CAUSES  Yeast vaginitis is caused by yeast (candida) that is normally found in your vagina. With a yeast infection, the candida has overgrown in number to a point that upsets the chemical balance. SYMPTOMS   White, thick vaginal discharge.  Swelling, itching, redness and irritation of the vagina and possibly the lips of the vagina (vulva).  Burning or painful urination.  Painful intercourse. DIAGNOSIS  Things that may contribute to monilial vaginitis are:  Postmenopausal and virginal states.  Pregnancy.  Infections.  Being tired, sick or stressed, especially if you had monilial vaginitis in the past.  Diabetes. Good control will help lower the chance.  Birth control pills.  Tight fitting garments.  Using bubble bath, feminine sprays, douches or deodorant tampons.  Taking certain medications that kill germs (antibiotics).  Sporadic recurrence can occur if you become ill. TREATMENT  Your caregiver will give you medication.  There are several kinds of anti monilial vaginal creams and suppositories specific for monilial vaginitis. For recurrent yeast infections, use a suppository or cream in the vagina 2 times a week, or as directed.  Anti-monilial or steroid cream for the itching or irritation of the vulva may also be used. Get your caregiver's permission.  Painting the vagina with methylene blue solution may help if the monilial cream does not work.  Eating yogurt may help prevent monilial vaginitis. HOME CARE INSTRUCTIONS   Finish all medication as prescribed.  Do not have sex until treatment is completed or after your caregiver tells you it is okay.  Take warm sitz baths.  Do not douche.  Do not use tampons, especially scented ones.  Wear cotton underwear.  Avoid tight pants and panty  hose.  Tell your sexual partner that you have a yeast infection. They should go to their caregiver if they have symptoms such as mild rash or itching.  Your sexual partner should be treated as well if your infection is difficult to eliminate.  Practice safer sex. Use condoms.  Some vaginal medications cause latex condoms to fail. Vaginal medications that harm condoms are:  Cleocin cream.  Butoconazole (Femstat).  Terconazole (Terazol) vaginal suppository.  Miconazole (Monistat) (may be purchased over the counter). SEEK MEDICAL CARE IF:   You have a temperature by mouth above 102 F (38.9 C).  The infection is getting worse after 2 days of treatment.  The infection is not getting better after 3 days of treatment.  You develop blisters in or around your vagina.  You develop vaginal bleeding, and it is not your menstrual period.  You have pain when you urinate.  You develop intestinal problems.  You have pain with sexual intercourse. Document Released: 07/04/2005 Document Revised: 12/17/2011 Document Reviewed: 03/18/2009 [facility] Patient Information 2015 [place], [place]. This information is not intended to replace advice given to you by your health care provider. Make sure you discuss any questions you have with your health care provider. Constipation Constipation is when a person has fewer than three bowel movements a week, has difficulty having a bowel movement, or has stools that are dry, hard, or larger than normal. As people grow older, constipation is more common. If you try to fix constipation with medicines that make you have a bowel movement (laxatives), the problem may get worse. Long-term laxative use may cause the muscles of the colon to become weak. A low-fiber diet, not taking in  enough fluids, and taking certain medicines may make constipation worse.  CAUSES   Certain medicines, such as antidepressants, pain medicine, iron supplements, antacids, and water  pills.   Certain diseases, such as diabetes, irritable bowel syndrome (IBS), thyroid disease, or depression.   Not drinking enough water.   Not eating enough fiber-rich foods.   Stress or travel.   Lack of physical activity or exercise.   Ignoring the urge to have a bowel movement.   Using laxatives too much.  SIGNS AND SYMPTOMS   Having fewer than three bowel movements a week.   Straining to have a bowel movement.   Having stools that are hard, dry, or larger than normal.   Feeling full or bloated.   Pain in the lower abdomen.   Not feeling relief after having a bowel movement.  DIAGNOSIS  Your health care provider will take a medical history and perform a physical exam. Further testing may be done for severe constipation. Some tests may include:  A barium enema X-ray to examine your rectum, colon, and, sometimes, your small intestine.   A sigmoidoscopy to examine your lower colon.   A colonoscopy to examine your entire colon. TREATMENT  Treatment will depend on the severity of your constipation and what is causing it. Some dietary treatments include drinking more fluids and eating more fiber-rich foods. Lifestyle treatments may include regular exercise. If these diet and lifestyle recommendations do not help, your health care provider may recommend taking over-the-counter laxative medicines to help you have bowel movements. Prescription medicines may be prescribed if over-the-counter medicines do not work.  HOME CARE INSTRUCTIONS   Eat foods that have a lot of fiber, such as fruits, vegetables, whole grains, and beans.  Limit foods high in fat and processed sugars, such as french fries, hamburgers, cookies, candies, and soda.   A fiber supplement may be added to your diet if you cannot get enough fiber from foods.   Drink enough fluids to keep your urine clear or pale yellow.   Exercise regularly or as directed by your health care provider.   Go  to the restroom when you have the urge to go. Do not hold it.   Only take over-the-counter or prescription medicines as directed by your health care provider. Do not take other medicines for constipation without talking to your health care provider first.  SEEK IMMEDIATE MEDICAL CARE IF:   You have bright red blood in your stool.   Your constipation lasts for more than 4 days or gets worse.   You have abdominal or rectal pain.   You have thin, pencil-like stools.   You have unexplained weight loss. MAKE SURE YOU:   Understand these instructions.  Will watch your condition.  Will get help right away if you are not doing well or get worse. Document Released: 06/22/2004 Document Revised: 09/29/2013 Document Reviewed: 07/06/2013 [facility] Patient Information 2015 [place], [place]. This information is not intended to replace advice given to you by your health care provider. Make sure you discuss any questions you have with your health care provider.

## 2015-07-04 NOTE — MAU Provider Note (Signed)
History     CSN: [id number]  Arrival date and time: 07/04/15 1615   First Provider Initiated Contact with Patient 07/04/15 1715      Chief Complaint  Patient presents with  . Abdominal Pain   This is a 34 y.o. female who presents with c/o "extreme pain" in lower abdomen from umbilicus to suprapubic area, which started after 12N today. States "I have to fly in here a few times a year when it hits and becomes severe".  States it radiates down her leg. States a nurse here told her she had an "inverted cervix".  Exams on these visits were all unremarkable per pt.  Has had a little spotting recently,uses DepoProvera for contraception, so does not have periods.  Has never had a pelvic US.  States did not call her GYN Dr Gaynell Face because her insurance ran out. 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   Has had care under gastroenterology in past, but only had an upper endoscopy (for upper abd pain) at that time. Did not tell them she was having chronic constipation. States they did tell her to take fiber and probiotics, which she did with good results but "now they don't make that brand anymore so I don't take them"  Also c/o urinary frequency and difficulty initiating urinary stream. Denies dysuria or flank pain.    Abdominal Pain This is a recurrent problem. The current episode started today. The onset quality is sudden. The problem occurs intermittently. The problem has been gradually improving. The pain is located in the periumbilical region, suprapubic region and LLQ. The pain is severe (earlier, but now does not hurt much). The quality of the pain is colicky, cramping and dull. Pain radiation: down left thigh. Associated symptoms include constipation and frequency. Pertinent negatives include no anorexia, diarrhea, dysuria, fever, hematuria, myalgias, nausea or vomiting. Nothing aggravates the pain. The pain is relieved by nothing. She has  tried nothing for the symptoms.   RN Note:  Expand All Collapse All   Having excruciating pain in lower abd, esp LLQ. Radiates down leg, also having pelvic pressure. Urge to pee, can't go. ? Constipation- last was yesterday          OB History    Gravida Para Term Preterm AB TAB SAB Ectopic Multiple Living   Past Medical History  Diagnosis Date  . GERD (gastroesophageal reflux disease)   . Migraines   . Cough   . Trouble swallowing   . Staph infection   . Fibromyalgia   . Thyroid nodule     Past Surgical History  Procedure Laterality Date  . Cesarean section  1996  . Liposection  2010  . Breast reduction  2005  . Abdomnioplasty      Family History  Problem Relation Age of Onset  . Anesthesia problems Neg Hx   . Alcohol abuse Father   . Heart disease Father   . Hyperlipidemia Father     Social History  Substance Use Topics  . Smoking status: Never Smoker   . Smokeless tobacco: Never Used  . Alcohol Use: Yes     Comment: once a week    Allergies:  Allergies  Allergen Reactions  . Vicodin [Hydrocodone-Acetaminophen] Nausea And Vomiting    Prescriptions prior to admission  Medication Sig Dispense Refill Last Dose  . cyclobenzaprine (FLEXERIL) 10 MG tablet  Take 10 mg by mouth 4 (four) times daily as needed for muscle spasms.   Past Month at Unknown time  . diazepam (VALIUM) 5 MG tablet Take 1 tablet (5 mg total) by mouth 2 (two) times daily. (Patient taking differently: Take 5 mg by mouth every 12 (twelve) hours as needed for anxiety. ) 10 tablet 0 03/27/2015 at Unknown time  . gabapentin (NEURONTIN) 100 MG capsule Take 100 mg by mouth 3 (three) times daily as needed (pain).   Past Month at Unknown time  . medroxyPROGESTERone (DEPO-PROVERA) 150 MG/ML injection Inject 150 mg into the muscle every 3 (three) months.   4 Past Week at Unknown time  . naproxen (NAPROSYN) 500 MG tablet Take 1 tablet (500 mg total) by mouth 2 (two) times daily.  (Patient not taking: Reported on 10/24/2014) 30 tablet 0   . ondansetron (ZOFRAN) 4 MG tablet Take 1 tablet (4 mg total) by mouth every 6 (six) hours. (Patient not taking: Reported on 03/28/2015) 12 tablet 0   . oxyCODONE-acetaminophen (PERCOCET/ROXICET) 5-325 MG per tablet Take 1-2 tablets by mouth every 6 (six) hours as needed for severe pain. (Patient not taking: Reported on 03/28/2015) 10 tablet 0 Not Taking at Unknown time   Medical, Surgical, Family and Social histories reviewed and are listed above.  Medications and allergies reviewed.   Review of Systems  Constitutional: Negative for fever, chills and malaise/fatigue.  Gastrointestinal: Positive for abdominal pain and constipation. Negative for heartburn, nausea, vomiting, diarrhea and anorexia.  Genitourinary: Positive for frequency. Negative for dysuria, urgency, hematuria and flank pain.  Musculoskeletal: Negative for myalgias and back pain.  Neurological: Negative for weakness.  Psychiatric/Behavioral: Positive for depression (in past). The patient is nervous/anxious (Takes daily valium for "nerves when I was in school" and for fibromyalgia).    Physical Exam   Blood pressure 112/72, pulse 90, temperature 98.9 F (37.2 C), temperature source Oral, resp. rate 18, weight 182 lb 3.2 oz (82.645 kg), last menstrual period 07/01/2015.  Physical Exam  Constitutional: She is oriented to person, place, and time. She appears well-developed and well-nourished. No distress (Does not appear uncomfortable at all).  HENT:  Head: Normocephalic.  Neck: Normal range of motion. Neck supple.  Cardiovascular: Normal rate and regular rhythm.   Respiratory: Effort normal. No respiratory distress.  GI: Soft. She exhibits no distension and no mass. There is no tenderness. There is no rebound and no guarding.  Genitourinary: Vagina normal. No vaginal discharge found.  Cervix long and closed Normal position, not inverted No cervical motion  tenderness Uterus small and nontender Adnexa nontender bilaterally Large amount of hard stool in rectum  Musculoskeletal: Normal range of motion.  Neurological: She is alert and oriented to person, place, and time.  Skin: Skin is warm and dry.  Psychiatric: She has a normal mood and affect.    MAU Course  Procedures  MDM GC/Chlamydia done per patient request Will order CBC and cmet to rule out infection or gallbladder/pancreas issues  Results for orders placed or performed during the hospital encounter of 07/04/15 (from the past 24 hour(s))  Urinalysis, Routine w reflex microscopic (not at [facility])     Status: Abnormal   Collection Time: 07/04/15  4:30 PM  Result Value Ref Range   Color, Urine YELLOW YELLOW   APPearance HAZY (A) CLEAR   Specific Gravity, Urine 1.015 1.005 - 1.030   pH 8.0 5.0 - 8.0   Glucose, UA NEGATIVE NEGATIVE mg/dL   Hgb urine dipstick NEGATIVE NEGATIVE  Bilirubin Urine NEGATIVE NEGATIVE   Ketones, ur NEGATIVE NEGATIVE mg/dL   Protein, ur NEGATIVE NEGATIVE mg/dL   Urobilinogen, UA 0.2 0.0 - 1.0 mg/dL   Nitrite NEGATIVE NEGATIVE   Leukocytes, UA NEGATIVE NEGATIVE  Wet prep, genital     Status: Abnormal   Collection Time: 07/04/15  5:00 PM  Result Value Ref Range   Yeast Wet Prep HPF POC FEW (A) NONE SEEN   Trich, Wet Prep NONE SEEN NONE SEEN   Clue Cells Wet Prep HPF POC FEW (A) NONE SEEN   WBC, Wet Prep HPF POC FEW (A) NONE SEEN  Pregnancy, urine POC     Status: None   Collection Time: 07/04/15  5:17 PM  Result Value Ref Range   Preg Test, Ur NEGATIVE NEGATIVE  CBC     Status: Abnormal   Collection Time: 07/04/15  6:15 PM  Result Value Ref Range   WBC 6.8 4.0 - 10.5 K/uL   RBC 5.19 (H) 3.87 - 5.11 MIL/uL   Hemoglobin 12.0 12.0 - 15.0 g/dL   HCT 40.9 81.1 - 91.4 %   MCV 72.3 (L) 78.0 - 100.0 fL   MCH 23.1 (L) 26.0 - 34.0 pg   MCHC 32.0 30.0 - 36.0 g/dL   RDW 78.2 95.6 - 21.3 %   Platelets 233 150 - 400 K/uL  Comprehensive metabolic panel      Status: None   Collection Time: 07/04/15  6:15 PM  Result Value Ref Range   Sodium 138 135 - 145 mmol/L   Potassium 3.9 3.5 - 5.1 mmol/L   Chloride 107 101 - 111 mmol/L   CO2 25 22 - 32 mmol/L   Glucose, Bld 97 65 - 99 mg/dL   BUN 9 6 - 20 mg/dL   Creatinine, Ser 0.86 0.44 - 1.00 mg/dL   Calcium 9.0 8.9 - 57.8 mg/dL   Total Protein 7.6 6.5 - 8.1 g/dL   Albumin 4.0 3.5 - 5.0 g/dL   AST 17 15 - 41 U/L   ALT 15 14 - 54 U/L   Alkaline Phosphatase 78 38 - 126 U/L   Total Bilirubin 0.3 0.3 - 1.2 mg/dL   GFR calc non Af Amer >60 >60 mL/min   GFR calc Af Amer >60 >60 mL/min   Anion gap 6 5 - 15   Enema given with little results.  Could not hold it in.   UA does not show blood in clinical picture less likely represent kidney stones. UA is negative for infection. Wet prep showed mild yeast so will treat.  Long discussion of IBS-Constipation and remedies including fiber, probiotics and kambucha. Given return precautions.  Assessment and Plan  A:  Abdominal pain, probably related to chronic constipation.       Yeast vaginitis      Urinary frequency with normal UA  P:  Discharge home       Discussed episodes of abdominal pain which come and go abruptly most likely represent colonic issues associated with constipation and gas pains. Not likely related to uterus or ovaries       Rx Fiber Con and Miralax for constipation        Recommend probiotics, greek yogurt, and kambucha        Rx Diflucan for yeast vaginitis        Followup with primary doctor for medical care  [facility] 07/04/2015, 5:18 PM

## 2015-07-04 NOTE — MAU Note (Addendum)
Having excruciating pain in lower abd, esp LLQ. Radiates down leg, also having pelvic pressure. Urge to pee, can't go.  ? Constipation- last was yesterday

## 2015-07-05 LAB — HIV ANTIBODY (ROUTINE TESTING W REFLEX): HIV Screen 4th Generation wRfx: NONREACTIVE

## 2015-07-05 LAB — GC/CHLAMYDIA PROBE AMP (~~LOC~~) NOT AT ARMC
Chlamydia: NEGATIVE
Neisseria Gonorrhea: NEGATIVE

## 2015-07-14 ENCOUNTER — Emergency Department (HOSPITAL_COMMUNITY): Payer: Medicaid Other

## 2015-07-14 ENCOUNTER — Encounter (HOSPITAL_COMMUNITY): Payer: Self-pay

## 2015-07-14 ENCOUNTER — Emergency Department (HOSPITAL_COMMUNITY)
Admission: EM | Admit: 2015-07-14 | Discharge: 2015-07-14 | Disposition: A | Discharge status: Home / Self Care | Payer: Self-pay | Attending: Emergency Medicine | Admitting: Emergency Medicine

## 2015-07-14 DIAGNOSIS — Z8639 Personal history of other endocrine, nutritional and metabolic disease: Secondary | ICD-10-CM | POA: Insufficient documentation

## 2015-07-14 DIAGNOSIS — Z79899 Other long term (current) drug therapy: Secondary | ICD-10-CM | POA: Insufficient documentation

## 2015-07-14 DIAGNOSIS — R42 Dizziness and giddiness: Secondary | ICD-10-CM | POA: Insufficient documentation

## 2015-07-14 DIAGNOSIS — Z8619 Personal history of other infectious and parasitic diseases: Secondary | ICD-10-CM | POA: Insufficient documentation

## 2015-07-14 DIAGNOSIS — R109 Unspecified abdominal pain: Secondary | ICD-10-CM | POA: Insufficient documentation

## 2015-07-14 DIAGNOSIS — M791 Myalgia: Secondary | ICD-10-CM | POA: Insufficient documentation

## 2015-07-14 DIAGNOSIS — R5383 Other fatigue: Secondary | ICD-10-CM | POA: Insufficient documentation

## 2015-07-14 DIAGNOSIS — G43909 Migraine, unspecified, not intractable, without status migrainosus: Secondary | ICD-10-CM | POA: Insufficient documentation

## 2015-07-14 DIAGNOSIS — K5900 Constipation, unspecified: Secondary | ICD-10-CM | POA: Insufficient documentation

## 2015-07-14 DIAGNOSIS — Z793 Long term (current) use of hormonal contraceptives: Secondary | ICD-10-CM | POA: Insufficient documentation

## 2015-07-14 LAB — CBC WITH DIFFERENTIAL/PLATELET
Basophils Absolute: 0 10*3/uL (ref 0.0–0.1)
Basophils Relative: 0 %
EOS PCT: 0 %
Eosinophils Absolute: 0 10*3/uL (ref 0.0–0.7)
HEMATOCRIT: 39.5 % (ref 36.0–46.0)
HEMOGLOBIN: 12.3 g/dL (ref 12.0–15.0)
LYMPHS ABS: 3.2 10*3/uL (ref 0.7–4.0)
LYMPHS PCT: 66 %
MCH: 22.7 pg — ABNORMAL LOW (ref 26.0–34.0)
MCHC: 31.1 g/dL (ref 30.0–36.0)
MCV: 72.7 fL — ABNORMAL LOW (ref 78.0–100.0)
MONOS PCT: 6 %
Monocytes Absolute: 0.3 10*3/uL (ref 0.1–1.0)
Neutro Abs: 1.3 10*3/uL — ABNORMAL LOW (ref 1.7–7.7)
Neutrophils Relative %: 28 %
Platelets: 279 10*3/uL (ref 150–400)
RBC: 5.43 MIL/uL — AB (ref 3.87–5.11)
RDW: 14.6 % (ref 11.5–15.5)
WBC: 4.8 10*3/uL (ref 4.0–10.5)

## 2015-07-14 LAB — COMPREHENSIVE METABOLIC PANEL
ALT: 16 U/L (ref 14–54)
AST: 21 U/L (ref 15–41)
Albumin: 3.7 g/dL (ref 3.5–5.0)
Alkaline Phosphatase: 77 U/L (ref 38–126)
Anion gap: 7 (ref 5–15)
BUN: 7 mg/dL (ref 6–20)
CHLORIDE: 106 mmol/L (ref 101–111)
CO2: 23 mmol/L (ref 22–32)
Calcium: 9 mg/dL (ref 8.9–10.3)
Creatinine, Ser: 0.65 mg/dL (ref 0.44–1.00)
GFR calc Af Amer: >60 mL/min (ref >60)
GFR calc non Af Amer: >60 mL/min (ref >60)
Glucose, Bld: 89 mg/dL (ref 65–99)
POTASSIUM: 4 mmol/L (ref 3.5–5.1)
SODIUM: 136 mmol/L (ref 135–145)
Total Bilirubin: 0.5 mg/dL (ref 0.3–1.2)
Total Protein: 7.4 g/dL (ref 6.5–8.1)

## 2015-07-14 LAB — LIPASE, BLOOD: Lipase: 45 U/L (ref 22–51)

## 2015-07-14 MED ORDER — ONDANSETRON HCL 4 MG/2ML IJ SOLN
4.0000 mg | Freq: Once | INTRAMUSCULAR | Status: AC
  Administered 2015-07-14: 4 mg via INTRAVENOUS
  Filled 2015-07-14: qty 2

## 2015-07-14 MED ORDER — IOHEXOL 300 MG/ML  SOLN
25.0000 mL | Freq: Once | INTRAMUSCULAR | Status: AC | PRN
  Administered 2015-07-14: 25 mL via ORAL

## 2015-07-14 MED ORDER — PROMETHAZINE HCL 25 MG PO TABS: 25.0000 mg | ORAL_TABLET | Freq: Four times a day (QID) | ORAL | Status: DC | PRN

## 2015-07-14 MED ORDER — SODIUM CHLORIDE 0.9 % IV SOLN
INTRAVENOUS | Status: DC
  Administered 2015-07-14: 09:00:00 via INTRAVENOUS

## 2015-07-14 MED ORDER — IOHEXOL 300 MG/ML  SOLN
100.0000 mL | Freq: Once | INTRAMUSCULAR | Status: AC | PRN
  Administered 2015-07-14: 100 mL via INTRAVENOUS

## 2015-07-14 MED ORDER — SODIUM CHLORIDE 0.9 % IV BOLUS (SEPSIS)
500.0000 mL | Freq: Once | INTRAVENOUS | Status: AC
  Administered 2015-07-14: 500 mL via INTRAVENOUS

## 2015-07-14 NOTE — Discharge Instructions (Signed)
Workup without any significant findings. No evidence of any bowel obstruction or intra-abdominal acute process no evidence of any significant constipation. Would recommend make an appointment to follow-up with your regular doctor. May very well need a colonoscopy for further evaluation. Take the Phenergan for nausea. Return for any new or worse symptoms. Work note provided.

## 2015-07-14 NOTE — ED Provider Notes (Signed)
CSN: [id number]     Arrival date & time 07/14/15  4098 History   First MD Initiated Contact with Patient 07/14/15 [phone]     Chief Complaint  Patient presents with  . Nausea  . Constipation  . Dizziness     (Consider location/radiation/quality/duration/timing/severity/associated sxs/prior Treatment) Patient is a 34 y.o. female presenting with constipation and dizziness. The history is provided by the patient.  Constipation Associated symptoms: abdominal pain and nausea   Associated symptoms: no dysuria and no vomiting   Dizziness Associated symptoms: nausea   Associated symptoms: no chest pain, no headaches, no shortness of breath and no vomiting    patient with several complaints. But main complaint seems to be constipation for 3 weeks. Associated with some mild abdominal discomfort some nausea no vomiting also with complaint of fatigue dizziness and some body aches. Patient also concerned that her nailbeds returning an unusual color. Patient has artificial nails on top of her nailbeds.  Past Medical History  Diagnosis Date  . GERD (gastroesophageal reflux disease)   . Migraines   . Cough   . Trouble swallowing   . Staph infection   . Fibromyalgia   . Thyroid nodule    Past Surgical History  Procedure Laterality Date  . Cesarean section  1996  . Liposection  2010  . Breast reduction  2005  . Abdomnioplasty     Family History  Problem Relation Age of Onset  . Anesthesia problems Neg Hx   . Alcohol abuse Father   . Heart disease Father   . Hyperlipidemia Father    Social History  Substance Use Topics  . Smoking status: Never Smoker   . Smokeless tobacco: Never Used  . Alcohol Use: Yes     Comment: once a week   OB History    Gravida Para Term Preterm AB TAB SAB Ectopic Multiple Living   1 1  1      1      Review of Systems  Constitutional: Positive for fatigue.  HENT: Negative for congestion.   Eyes: Negative for visual disturbance.  Respiratory: Negative for  shortness of breath.   Cardiovascular: Negative for chest pain.  Gastrointestinal: Positive for nausea, abdominal pain and constipation. Negative for vomiting.  Genitourinary: Negative for dysuria.  Musculoskeletal: Positive for myalgias.  Skin: Negative for rash.  Neurological: Positive for dizziness. Negative for headaches.  Hematological: Does not bruise/bleed easily.  Psychiatric/Behavioral: Negative for confusion.      Allergies  Vicodin  Home Medications   Prior to Admission medications   Medication Sig Start Date End Date Taking? Authorizing Provider  ibuprofen (ADVIL,MOTRIN) 200 MG tablet Take 800 mg by mouth every 6 (six) hours as needed for moderate pain.    Yes Historical Provider, MD  Cholecalciferol (VITAMIN D PO) Take 1 capsule by mouth daily.    Historical Provider, MD  cyclobenzaprine (FLEXERIL) 10 MG tablet Take 10 mg by mouth 4 (four) times daily as needed for muscle spasms.    Historical Provider, MD  diazepam (VALIUM) 5 MG tablet Take 1 tablet (5 mg total) by mouth 2 (two) times daily. Patient taking differently: Take 5 mg by mouth daily as needed for anxiety.  11/24/13   Junius Finner, PA-C  fluconazole (DIFLUCAN) 150 MG tablet Take 1 tablet (150 mg total) by mouth once. Patient not taking: Reported on 07/14/2015 07/04/15   Aviva Signs, CNM  gabapentin (NEURONTIN) 100 MG capsule Take 100 mg by mouth 3 (three) times daily as needed (pain).  Historical Provider, MD  medroxyPROGESTERone (DEPO-PROVERA) 150 MG/ML injection Inject 150 mg into the muscle every 3 (three) months.  10/16/14   Historical Provider, MD  oxyCODONE-acetaminophen (PERCOCET/ROXICET) 5-325 MG per tablet Take 1-2 tablets by mouth every 6 (six) hours as needed for severe pain. Patient not taking: Reported on 03/28/2015 10/24/14   Blake Divine, MD  polycarbophil (FIBERCON) 625 MG tablet Take 1 tablet (625 mg total) by mouth daily. Patient not taking: Reported on 07/14/2015 07/04/15   Aviva Signs,  CNM  polyethylene glycol [facility]) packet Take 17 g by mouth daily. 07/04/15   Aviva Signs, CNM  promethazine (PHENERGAN) 25 MG tablet Take 1 tablet (25 mg total) by mouth every 6 (six) hours as needed for nausea or vomiting. 07/14/15   Vanetta Mulders, MD   BP 113/75 mmHg  Pulse 79  Temp(Src) 98.1 F (36.7 C) (Oral)  Resp 21  SpO2 100%  LMP 07/01/2015 (Exact Date) Physical Exam  Constitutional: She is oriented to person, place, and time. She appears well-developed and well-nourished. No distress.  HENT:  Head: Normocephalic and atraumatic.  Mouth/Throat: Oropharynx is clear and moist.  Eyes: Conjunctivae and EOM are normal. Pupils are equal, round, and reactive to light.  Neck: Normal range of motion. Neck supple.  Cardiovascular: Normal rate, regular rhythm and normal heart sounds.   No murmur heard. Pulmonary/Chest: Effort normal and breath sounds normal. No respiratory distress.  Abdominal: Soft. Bowel sounds are normal. There is no tenderness.  Musculoskeletal: Normal range of motion. She exhibits no edema.  Patient's nails with some discoloration but it's hard to tell because her artificial nails on top of the regular nails. Refill to the fingertips is 2 seconds.  Neurological: She is alert and oriented to person, place, and time. No cranial nerve deficit. She exhibits normal muscle tone. Coordination normal.  Skin: Skin is warm. No rash noted.  Nursing note and vitals reviewed.   ED Course  Procedures (including critical care time) Labs Review Labs Reviewed  CBC WITH DIFFERENTIAL/PLATELET - Abnormal; Notable for the following:    RBC 5.43 (*)    MCV 72.7 (*)    MCH 22.7 (*)    Neutro Abs 1.3 (*)    All other components within normal limits  COMPREHENSIVE METABOLIC PANEL  LIPASE, BLOOD   Results for orders placed or performed during the hospital encounter of 07/14/15  Comprehensive metabolic panel  Result Value Ref Range   Sodium 136 135 - 145 mmol/L   Potassium  4.0 3.5 - 5.1 mmol/L   Chloride 106 101 - 111 mmol/L   CO2 23 22 - 32 mmol/L   Glucose, Bld 89 65 - 99 mg/dL   BUN 7 6 - 20 mg/dL   Creatinine, Ser 1.61 0.44 - 1.00 mg/dL   Calcium 9.0 8.9 - 09.6 mg/dL   Total Protein 7.4 6.5 - 8.1 g/dL   Albumin 3.7 3.5 - 5.0 g/dL   AST 21 15 - 41 U/L   ALT 16 14 - 54 U/L   Alkaline Phosphatase 77 38 - 126 U/L   Total Bilirubin 0.5 0.3 - 1.2 mg/dL   GFR calc non Af Amer >60 >60 mL/min   GFR calc Af Amer >60 >60 mL/min   Anion gap 7 5 - 15  Lipase, blood  Result Value Ref Range   Lipase 45 22 - 51 U/L  CBC with Differential/Platelet  Result Value Ref Range   WBC 4.8 4.0 - 10.5 K/uL   RBC 5.43 (H)  3.87 - 5.11 MIL/uL   Hemoglobin 12.3 12.0 - 15.0 g/dL   HCT 16.1 09.6 - 04.5 %   MCV 72.7 (L) 78.0 - 100.0 fL   MCH 22.7 (L) 26.0 - 34.0 pg   MCHC 31.1 30.0 - 36.0 g/dL   RDW 40.9 81.1 - 91.4 %   Platelets 279 150 - 400 K/uL   Neutrophils Relative % 28 %   Lymphocytes Relative 66 %   Monocytes Relative 6 %   Eosinophils Relative 0 %   Basophils Relative 0 %   Neutro Abs 1.3 (L) 1.7 - 7.7 K/uL   Lymphs Abs 3.2 0.7 - 4.0 K/uL   Monocytes Absolute 0.3 0.1 - 1.0 K/uL   Eosinophils Absolute 0.0 0.0 - 0.7 K/uL   Basophils Absolute 0.0 0.0 - 0.1 K/uL   RBC Morphology ELLIPTOCYTES    WBC Morphology ATYPICAL LYMPHOCYTES      Imaging Review Ct Abdomen Pelvis W Contrast  07/14/2015   CLINICAL DATA:  Acute back pain with nausea and constipation.  EXAM: CT ABDOMEN AND PELVIS WITH CONTRAST  TECHNIQUE: Multidetector CT imaging of the abdomen and pelvis was performed using the standard protocol following bolus administration of intravenous contrast.  CONTRAST:  OMNIPAQUE IOHEXOL 300 MG/ML  SOLN  COMPARISON:  CT scan of April 16, 2008.  FINDINGS: Visualized lung bases appear normal. No significant osseous abnormality is noted.  No gallstones are noted. The liver, spleen and pancreas appear normal. Adrenal glands and kidneys appear normal. No hydronephrosis  or renal obstruction is noted. The appendix appears normal. There is no evidence of bowel obstruction. No abnormal fluid collection is noted. Uterus and ovaries appear normal. Urinary bladder appears normal. No significant adenopathy is noted.  IMPRESSION: No significant abnormality seen in the abdomen or pelvis.   Electronically Signed   By: Lupita Raider, M.D.   On: 07/14/2015 11:25   I have personally reviewed and evaluated these images and lab results as part of my medical decision-making.   EKG Interpretation None      MDM   Final diagnoses:  Abdominal pain, unspecified abdominal location    Patient with several complaints. Main one being a feeling of constipation no bowel movement for 3 weeks. Not much relief with any of the laxity of medicines. Patient with some fatigue some dizziness without room spinning and nausea without vomiting. Some mild generalized abdominal pain. Also patient was concerned about the discoloration of her nailbeds. Patient has artificial nails recommend removing them and letting the regular nails breathe. Workup for the other symptoms without any significant findings. No lab abnormality CT abdomen and pelvis without any acute findings also no evidence of any significant constipation. Recommend follow-up with primary care doctor and possible colonoscopy.    Vanetta Mulders, MD 07/14/15 1150

## 2015-07-14 NOTE — ED Notes (Signed)
Pt arrived by POV c/o nausea, fatigue, constipation, dizziness, right hip pain, and her nails are turning green going on 3weeks

## 2015-09-10 ENCOUNTER — Encounter (HOSPITAL_COMMUNITY): Payer: Self-pay | Admitting: Emergency Medicine

## 2015-09-10 ENCOUNTER — Emergency Department (HOSPITAL_COMMUNITY)
Admission: EM | Admit: 2015-09-10 | Discharge: 2015-09-10 | Disposition: A | Discharge status: Home / Self Care | Payer: Self-pay | Attending: Emergency Medicine | Admitting: Emergency Medicine

## 2015-09-10 ENCOUNTER — Emergency Department (HOSPITAL_COMMUNITY): Payer: Medicaid Other

## 2015-09-10 DIAGNOSIS — M797 Fibromyalgia: Secondary | ICD-10-CM | POA: Insufficient documentation

## 2015-09-10 DIAGNOSIS — G43909 Migraine, unspecified, not intractable, without status migrainosus: Secondary | ICD-10-CM | POA: Insufficient documentation

## 2015-09-10 DIAGNOSIS — Z8619 Personal history of other infectious and parasitic diseases: Secondary | ICD-10-CM | POA: Insufficient documentation

## 2015-09-10 DIAGNOSIS — M545 Low back pain: Secondary | ICD-10-CM | POA: Insufficient documentation

## 2015-09-10 DIAGNOSIS — Z8719 Personal history of other diseases of the digestive system: Secondary | ICD-10-CM | POA: Insufficient documentation

## 2015-09-10 DIAGNOSIS — Z8639 Personal history of other endocrine, nutritional and metabolic disease: Secondary | ICD-10-CM | POA: Insufficient documentation

## 2015-09-10 DIAGNOSIS — M549 Dorsalgia, unspecified: Secondary | ICD-10-CM

## 2015-09-10 DIAGNOSIS — Z79899 Other long term (current) drug therapy: Secondary | ICD-10-CM | POA: Insufficient documentation

## 2015-09-10 MED ORDER — OXYCODONE-ACETAMINOPHEN 5-325 MG PO TABS: 1.0000 | ORAL_TABLET | ORAL | Status: DC | PRN

## 2015-09-10 MED ORDER — PREDNISONE 20 MG PO TABS: 40.0000 mg | ORAL_TABLET | Freq: Every day | ORAL | Status: DC

## 2015-09-10 MED ORDER — OXYCODONE-ACETAMINOPHEN 5-325 MG PO TABS
2.0000 | ORAL_TABLET | Freq: Once | ORAL | Status: AC
  Administered 2015-09-10: 2 via ORAL
  Filled 2015-09-10: qty 2

## 2015-09-10 NOTE — ED Notes (Signed)
Pt sts lower back pain with radiation down right leg x 1 month

## 2015-09-10 NOTE — Discharge Instructions (Signed)
Take the prescribed medication as directed. Follow-up with the [facility] if you continue having issues. Return to the ED for new or worsening symptoms.   Back Pain, Adult Back pain is very common in adults.The cause of back pain is rarely dangerous and the pain often gets better over time.The cause of your back pain may not be known. Some common causes of back pain include:  Strain of the muscles or ligaments supporting the spine.  Wear and tear (degeneration) of the spinal disks.  Arthritis.  Direct injury to the back. For many people, back pain may return. Since back pain is rarely dangerous, most people can learn to manage this condition on their own. HOME CARE INSTRUCTIONS Watch your back pain for any changes. The following actions may help to lessen any discomfort you are feeling:  Remain active. It is stressful on your back to sit or stand in one place for long periods of time. Do not sit, drive, or stand in one place for more than 30 minutes at a time. Take short walks on even surfaces as soon as you are able.Try to increase the length of time you walk each day.  Exercise regularly as directed by your health care provider. Exercise helps your back heal faster. It also helps avoid future injury by keeping your muscles strong and flexible.  Do not stay in bed.Resting more than 1-2 days can delay your recovery.  Pay attention to your body when you bend and lift. The most comfortable positions are those that put less stress on your recovering back. Always use proper lifting techniques, including:  Bending your knees.  Keeping the load close to your body.  Avoiding twisting.  Find a comfortable position to sleep. Use a firm mattress and lie on your side with your knees slightly bent. If you lie on your back, put a pillow under your knees.  Avoid feeling anxious or stressed.Stress increases muscle tension and can worsen back pain.It is important to recognize when  you are anxious or stressed and learn ways to manage it, such as with exercise.  Take medicines only as directed by your health care provider. Over-the-counter medicines to reduce pain and inflammation are often the most helpful.Your health care provider may prescribe muscle relaxant drugs.These medicines help dull your pain so you can more quickly return to your normal activities and healthy exercise.  Apply ice to the injured area:  Put ice in a plastic bag.  Place a towel between your skin and the bag.  Leave the ice on for 20 minutes, 2-3 times a day for the first 2-3 days. After that, ice and heat may be alternated to reduce pain and spasms.  Maintain a healthy weight. Excess weight puts extra stress on your back and makes it difficult to maintain good posture. SEEK MEDICAL CARE IF:  You have pain that is not relieved with rest or medicine.  You have increasing pain going down into the legs or buttocks.  You have pain that does not improve in one week.  You have night pain.  You lose weight.  You have a fever or chills. SEEK IMMEDIATE MEDICAL CARE IF:   You develop new bowel or bladder control problems.  You have unusual weakness or numbness in your arms or legs.  You develop nausea or vomiting.  You develop abdominal pain.  You feel faint.   This information is not intended to replace advice given to you by your health care provider. Make sure  you discuss any questions you have with your health care provider.   Document Released: 09/24/2005 Document Revised: 10/15/2014 Document Reviewed: 01/26/2014 Elsevier Interactive Patient Education 2016 [employer]  Sciatica Sciatica is pain, weakness, numbness, or tingling along the path of the sciatic nerve. The nerve starts in the lower back and runs down the back of each leg. The nerve controls the muscles in the lower leg and in the back of the knee, while also providing sensation to the back of the thigh, lower leg,  and the sole of your foot. Sciatica is a symptom of another medical condition. For instance, nerve damage or certain conditions, such as a herniated disk or bone spur on the spine, pinch or put pressure on the sciatic nerve. This causes the pain, weakness, or other sensations normally associated with sciatica. Generally, sciatica only affects one side of the body. CAUSES   Herniated or slipped disc.  Degenerative disk disease.  A pain disorder involving the narrow muscle in the buttocks (piriformis syndrome).  Pelvic injury or fracture.  Pregnancy.  Tumor (rare). SYMPTOMS  Symptoms can vary from mild to very severe. The symptoms usually travel from the low back to the buttocks and down the back of the leg. Symptoms can include:  Mild tingling or dull aches in the lower back, leg, or hip.  Numbness in the back of the calf or sole of the foot.  Burning sensations in the lower back, leg, or hip.  Sharp pains in the lower back, leg, or hip.  Leg weakness.  Severe back pain inhibiting movement. These symptoms may get worse with coughing, sneezing, laughing, or prolonged sitting or standing. Also, being overweight may worsen symptoms. DIAGNOSIS  Your caregiver will perform a physical exam to look for common symptoms of sciatica. He or she may ask you to do certain movements or activities that would trigger sciatic nerve pain. Other tests may be performed to find the cause of the sciatica. These may include:  Blood tests.  X-rays.  Imaging tests, such as an MRI or CT scan. TREATMENT  Treatment is directed at the cause of the sciatic pain. Sometimes, treatment is not necessary and the pain and discomfort goes away on its own. If treatment is needed, your caregiver may suggest:  Over-the-counter medicines to relieve pain.  Prescription medicines, such as anti-inflammatory medicine, muscle relaxants, or narcotics.  Applying heat or ice to the painful area.  Steroid injections to  lessen pain, irritation, and inflammation around the nerve.  Reducing activity during periods of pain.  Exercising and stretching to strengthen your abdomen and improve flexibility of your spine. Your caregiver may suggest losing weight if the extra weight makes the back pain worse.  Physical therapy.  Surgery to eliminate what is pressing or pinching the nerve, such as a bone spur or part of a herniated disk. HOME CARE INSTRUCTIONS   Only take over-the-counter or prescription medicines for pain or discomfort as directed by your caregiver.  Apply ice to the affected area for 20 minutes, 3-4 times a day for the first 48-72 hours. Then try heat in the same way.  Exercise, stretch, or perform your usual activities if these do not aggravate your pain.  Attend physical therapy sessions as directed by your caregiver.  Keep all follow-up appointments as directed by your caregiver.  Do not wear high heels or shoes that do not provide proper support.  Check your mattress to see if it is too soft. A firm mattress may lessen  your pain and discomfort. SEEK IMMEDIATE MEDICAL CARE IF:   You lose control of your bowel or bladder (incontinence).  You have increasing weakness in the lower back, pelvis, buttocks, or legs.  You have redness or swelling of your back.  You have a burning sensation when you urinate.  You have pain that gets worse when you lie down or awakens you at night.  Your pain is worse than you have experienced in the past.  Your pain is lasting longer than 4 weeks.  You are suddenly losing weight without reason. MAKE SURE YOU:  Understand these instructions.  Will watch your condition.  Will get help right away if you are not doing well or get worse.   This information is not intended to replace advice given to you by your health care provider. Make sure you discuss any questions you have with your health care provider.   Document Released: 09/18/2001 Document  Revised: 06/15/2015 Document Reviewed: 02/03/2012 Elsevier Interactive Patient Education [employer]

## 2015-09-10 NOTE — ED Provider Notes (Signed)
CSN: [id number]     Arrival date & time 09/10/15  1703 History   First MD Initiated Contact with Patient 09/10/15 1713     Chief Complaint  Patient presents with  . Back Pain     (Consider location/radiation/quality/duration/timing/severity/associated sxs/prior Treatment) Patient is a 34 y.o. female presenting with back pain. The history is provided by the patient and medical records.  Back Pain    34 year old female with history of GERD, migraines, thyroid nodule, presenting to the ED for back pain. Patient states she was initially having a mild backache. She states she tried to avoid coming to the hospital so she went and saw a [facility]. She states she had 2 visits with a chiropractor, each visit with different adjustment maneuvers. She states her pain is now worse than ever. States pain is localized to the midline lumbar spine with radiation down both legs, however is worse in her right leg. Pain is notably worse with change in position, or sitting for long periods of time. She denies any numbness or weakness of her legs. No loss of bowel or bladder control. Patient denies any known back injury. No history of back surgeries.  Past Medical History  Diagnosis Date  . GERD (gastroesophageal reflux disease)   . Migraines   . Cough   . Trouble swallowing   . Staph infection   . Fibromyalgia   . Thyroid nodule    Past Surgical History  Procedure Laterality Date  . Cesarean section  1996  . Liposection  2010  . Breast reduction  2005  . Abdomnioplasty     Family History  Problem Relation Age of Onset  . Anesthesia problems Neg Hx   . Alcohol abuse Father   . Heart disease Father   . Hyperlipidemia Father    Social History  Substance Use Topics  . Smoking status: Never Smoker   . Smokeless tobacco: Never Used  . Alcohol Use: Yes     Comment: once a week   OB History    Gravida Para Term Preterm AB TAB SAB Ectopic Multiple Living   1 1  1      1      Review of Systems   Musculoskeletal: Positive for back pain.  All other systems reviewed and are negative.     Allergies  Vicodin  Home Medications   Prior to Admission medications   Medication Sig Start Date End Date Taking? Authorizing Provider  Cholecalciferol (VITAMIN D PO) Take 1 capsule by mouth daily.    Historical Provider, MD  cyclobenzaprine (FLEXERIL) 10 MG tablet Take 10 mg by mouth 4 (four) times daily as needed for muscle spasms.    Historical Provider, MD  diazepam (VALIUM) 5 MG tablet Take 1 tablet (5 mg total) by mouth 2 (two) times daily. Patient taking differently: Take 5 mg by mouth daily as needed for anxiety.  11/24/13   Junius FinnerErin O'Malley, PA-C  fluconazole (DIFLUCAN) 150 MG tablet Take 1 tablet (150 mg total) by mouth once. Patient not taking: Reported on 07/14/2015 07/04/15   Aviva SignsMarie L Williams, CNM  gabapentin (NEURONTIN) 100 MG capsule Take 100 mg by mouth 3 (three) times daily as needed (pain).    Historical Provider, MD  ibuprofen (ADVIL,MOTRIN) 200 MG tablet Take 800 mg by mouth every 6 (six) hours as needed for moderate pain.     Historical Provider, MD  medroxyPROGESTERone (DEPO-PROVERA) 150 MG/ML injection Inject 150 mg into the muscle every 3 (three) months.  10/16/14   Historical  Provider, MD  oxyCODONE-acetaminophen (PERCOCET/ROXICET) 5-325 MG per tablet Take 1-2 tablets by mouth every 6 (six) hours as needed for severe pain. Patient not taking: Reported on 03/28/2015 10/24/14   Blake Divine, MD  polycarbophil (FIBERCON) 625 MG tablet Take 1 tablet (625 mg total) by mouth daily. Patient not taking: Reported on 07/14/2015 07/04/15   Aviva Signs, CNM  polyethylene glycol [facility]) packet Take 17 g by mouth daily. 07/04/15   Aviva Signs, CNM  promethazine (PHENERGAN) 25 MG tablet Take 1 tablet (25 mg total) by mouth every 6 (six) hours as needed for nausea or vomiting. 07/14/15   Vanetta Mulders, MD   BP 112/83 mmHg  Pulse 88  Temp(Src) 98.2 F (36.8 C) (Oral)  Resp 20  SpO2  99%   Physical Exam  Constitutional: She is oriented to person, place, and time. She appears well-developed and well-nourished. No distress.  HENT:  Head: Normocephalic and atraumatic.  Mouth/Throat: Oropharynx is clear and moist.  Eyes: Conjunctivae and EOM are normal. Pupils are equal, round, and reactive to light.  Neck: Normal range of motion. Neck supple.  Cardiovascular: Normal rate, regular rhythm and normal heart sounds.   Pulmonary/Chest: Effort normal and breath sounds normal. No respiratory distress. She has no wheezes.  Abdominal: Soft. Bowel sounds are normal. There is no tenderness. There is no guarding.  Musculoskeletal: Normal range of motion.       Lumbar back: She exhibits tenderness, bony tenderness and pain.       Back:  Midline lumbar TTP without noted deformity; limited flexion forward due to pain; endorses radiating pain down both legs, R > L; normal strength and sensation of BLE; normal gait  Neurological: She is alert and oriented to person, place, and time.  Skin: Skin is warm and dry. She is not diaphoretic.  Psychiatric: She has a normal mood and affect.  Nursing note and vitals reviewed.   ED Course  Procedures (including critical care time) Labs Review Labs Reviewed - No data to display  Imaging Review Dg Lumbar Spine Complete  09/10/2015  CLINICAL DATA:  Low back pain extending into both legs, worse on the right. Symptoms for 2 months. Symptoms worse after chiropractic adjustment. EXAM: LUMBAR SPINE - COMPLETE 4+ VIEW COMPARISON:  CT of the abdomen and pelvis 07/14/2015. FINDINGS: Five non rib-bearing lumbar type vertebral bodies are present. Vertebral body heights and alignment are maintained. Facet degenerative changes are again noted at L5-S1, right greater than left. Disc heights are preserved. IMPRESSION: 1. Mild facet degenerative changes are most evident at L5-S1. 2. Otherwise negative lumbar spine radiographs. Electronically Signed   By:  Marin Roberts M.D.   On: 09/10/2015 19:16   I have personally reviewed and evaluated these images and lab results as part of my medical decision-making.   EKG Interpretation None      MDM   Final diagnoses:  Back pain, unspecified location   34 year old female here with low back pain for the past month. States worsened after seeing a chiropractor for adjustments. Reports radiating down down both legs, worse on right. No red-flag symptoms or focal neurologic deficits on exam today to suggest cauda equina. Patient is ambulatory with steady gait. Given her symptoms and recent chiropractic adjustment, x-ray was obtained which is remarkable for degenerative changes.  Patient has had some relief with Percocet. Feel her symptoms are likely due to lumbar radiculopathy.  Will discharge home with pain meds and prednisone.  Patient currently does not have a PCP,  will refer to wellness clinic for follow-up.  Discussed plan with patient, he/she acknowledged understanding and agreed with plan of care.  Return precautions given for new or worsening symptoms.  Garlon Hatchet, PA-C 09/10/15 1950  Mancel Bale, MD 09/10/15 [phone]

## 2015-09-23 ENCOUNTER — Ambulatory Visit: Payer: Self-pay | Attending: Physician Assistant | Admitting: Physician Assistant

## 2015-09-23 VITALS — BP 119/83 | HR 95 | Temp 98.7°F | Resp 16 | Ht 62.0 in | Wt 192.0 lb

## 2015-09-23 DIAGNOSIS — L299 Pruritus, unspecified: Secondary | ICD-10-CM | POA: Insufficient documentation

## 2015-09-23 DIAGNOSIS — Z885 Allergy status to narcotic agent status: Secondary | ICD-10-CM | POA: Insufficient documentation

## 2015-09-23 DIAGNOSIS — M5440 Lumbago with sciatica, unspecified side: Secondary | ICD-10-CM | POA: Insufficient documentation

## 2015-09-23 DIAGNOSIS — Z79899 Other long term (current) drug therapy: Secondary | ICD-10-CM | POA: Insufficient documentation

## 2015-09-23 DIAGNOSIS — M797 Fibromyalgia: Secondary | ICD-10-CM | POA: Insufficient documentation

## 2015-09-23 DIAGNOSIS — R21 Rash and other nonspecific skin eruption: Secondary | ICD-10-CM | POA: Insufficient documentation

## 2015-09-23 DIAGNOSIS — K219 Gastro-esophageal reflux disease without esophagitis: Secondary | ICD-10-CM | POA: Insufficient documentation

## 2015-09-23 DIAGNOSIS — M5416 Radiculopathy, lumbar region: Secondary | ICD-10-CM | POA: Insufficient documentation

## 2015-09-23 MED ORDER — OXYCODONE-ACETAMINOPHEN 5-325 MG PO TABS: 1.0000 | ORAL_TABLET | ORAL | Status: DC | PRN

## 2015-09-23 MED ORDER — PREDNISONE 20 MG PO TABS: 40.0000 mg | ORAL_TABLET | Freq: Every day | ORAL | Status: DC

## 2015-09-23 NOTE — Progress Notes (Signed)
Meredith Reeves  ZOX:[id number]  [id number]  DOB - 01-28-1981  Chief Complaint  Patient presents with  . Hospitalization Follow-up  . Back Pain       Subjective:   Meredith Reeves is a 34 y.o. female here today for establishment of care. She was in the ED on 09/10/15 with back pain. Her back pain initially started several weeks ago. She went to a chiropractor twice and her symptoms worsened. It went from her mid back to her lower back and now she's having radiation down bilateral lower extremities. She hurts with standing for long periods and with sitting. Is hurting with changing positions. It hurts with walking as well. She is in between jobs at this time. She was given a prescription for Percocet and prednisone and had temporary relief of her symptoms. She is out of these medications. She does not have insurance but would like to see an orthopedic doctor at some point.  She also has noticed for the last several weeks. Of increase in itching in bilateral upper extremities and along the breast area. Sometimes she feels we can see a rash but it dissipates rather quickly. It usually only happens when she goes to bed. She has looked for bedbugs and cannot find anything. She has frequently washed her sheets. She has not changed any of her habits.  ROS: GEN: denies fever or chills, denies change in weight Skin: + lesions or rashes HEENT: denies headache, earache, epistaxis, sore throat, or neck pain LUNGS: denies SHOB, dyspnea, PND, orthopnea EXT: denies muscle spasms or swelling; + pain in lower ext and back, no weakness NEURO: denies numbness or tingling, denies sz, stroke or TIA  ALLERGIES: Allergies  Allergen Reactions  . Vicodin [Hydrocodone-Acetaminophen] Nausea And Vomiting    PAST MEDICAL HISTORY: Past Medical History  Diagnosis Date  . GERD (gastroesophageal reflux disease)   . Migraines   . Cough   . Trouble swallowing   . Staph infection   . Fibromyalgia   .  Thyroid nodule     PAST SURGICAL HISTORY: Past Surgical History  Procedure Laterality Date  . Cesarean section  1996  . Liposection  2010  . Breast reduction  2005  . Abdomnioplasty      MEDICATIONS AT HOME: Prior to Admission medications   Medication Sig Start Date End Date Taking? Authorizing Provider  Cholecalciferol (VITAMIN D PO) Take 1 capsule by mouth daily.   Yes Historical Provider, MD  cyclobenzaprine (FLEXERIL) 10 MG tablet Take 10 mg by mouth 4 (four) times daily as needed for muscle spasms.   Yes Historical Provider, MD  diazepam (VALIUM) 5 MG tablet Take 1 tablet (5 mg total) by mouth 2 (two) times daily. Patient taking differently: Take 5 mg by mouth daily as needed for anxiety.  11/24/13  Yes Junius Finner, PA-C  gabapentin (NEURONTIN) 100 MG capsule Take 100 mg by mouth 3 (three) times daily as needed (pain).   Yes Historical Provider, MD  ibuprofen (ADVIL,MOTRIN) 200 MG tablet Take 800 mg by mouth every 6 (six) hours as needed for moderate pain.    Yes Historical Provider, MD  medroxyPROGESTERone (DEPO-PROVERA) 150 MG/ML injection Inject 150 mg into the muscle every 3 (three) months.  10/16/14  Yes Historical Provider, MD  oxyCODONE-acetaminophen (PERCOCET/ROXICET) 5-325 MG tablet Take 1 tablet by mouth every 4 (four) hours as needed. 09/23/15  Yes Sirron Francesconi Netta Cedars, PA-C  predniSONE (DELTASONE) 20 MG tablet Take 2 tablets (40 mg total) by mouth daily. Take 40 mg  by mouth daily for 3 days, then 20mg  by mouth daily for 3 days, then 10mg  daily for 3 days 09/23/15  Yes Ritesh Opara Netta CedarsS Akemi Overholser, PA-C  promethazine (PHENERGAN) 25 MG tablet Take 1 tablet (25 mg total) by mouth every 6 (six) hours as needed for nausea or vomiting. 07/14/15  Yes Vanetta MuldersScott Zackowski, MD  fluconazole (DIFLUCAN) 150 MG tablet Take 1 tablet (150 mg total) by mouth once. Patient not taking: Reported on 07/14/2015 07/04/15   Aviva SignsMarie L Williams, CNM  polycarbophil (FIBERCON) 625 MG tablet Take 1 tablet (625 mg total) by mouth  daily. Patient not taking: Reported on 07/14/2015 07/04/15   Aviva SignsMarie L Williams, CNM  polyethylene glycol [facility]IRAL[facility]) packet Take 17 g by mouth daily. Patient not taking: Reported on 09/23/2015 07/04/15   Aviva SignsMarie L Williams, CNM     Objective:   Filed Vitals:   09/23/15 1006  BP: 119/83  Pulse: 95  Temp: 98.7 F (37.1 C)  TempSrc: Oral  Resp: 16  Height: 5\' 2"  (1.575 m)  Weight: 192 lb (87.091 kg)  SpO2: 100%    Exam General appearance : Awake, alert, not in any distress. Speech Clear. Not toxic looking Skin-no lesions or rashes today Neck: supple, no JVD. No cervical lymphadenopathy.  Chest:Good air entry bilaterally, no added sounds  Extremities: B/L Lower Ext shows no edema, both legs are warm to touch, decreased ROM with bending forward, pain not in muscle but along spine Neurology: Awake alert, and oriented X 3, CN II-XII intact, Non focal   Assessment & Plan  1. Low back pain/lumbar radiculopathy  -refill prednisone and percocet once  -if not better in 1 month, referral to ortho  -discussed strengthening exercises  -avoid chiropractor 2. Pruritus  -OTC Zyrtec and hydrocortisone as needed     Return in about 4 weeks (around 10/21/2015).For routine health maintenance.  The patient was given clear instructions to go to ER or return to medical center if symptoms don't improve, worsen or new problems develop. The patient verbalized understanding. The patient was told to call to get lab results if they haven't heard anything in the next week.   This note has been created with Education officer, environmentalDragon speech recognition software and smart phrase technology. Any transcriptional errors are unintentional.    Scot Juniffany Dannel Rafter, PA-C [facility] [place], [place] [phone]   09/23/2015, 10:32 AM

## 2015-09-23 NOTE — Progress Notes (Signed)
Patient's her for  ED f/up for back pain 3/10 describes as constant pain in lower back radiating down both legs, but more on the right leg  Patient states she's having itching in forearm, thighs and back of knees, describes pain as annoying, with no pain.  Patient requesting refill on Prednisone.

## 2015-10-30 ENCOUNTER — Encounter (HOSPITAL_COMMUNITY): Payer: Self-pay | Admitting: Adult Health

## 2015-10-30 ENCOUNTER — Emergency Department (HOSPITAL_COMMUNITY)
Admission: EM | Admit: 2015-10-30 | Discharge: 2015-10-30 | Disposition: A | Discharge status: Home / Self Care | Payer: Medicaid Other | Attending: Emergency Medicine | Admitting: Emergency Medicine

## 2015-10-30 DIAGNOSIS — G43909 Migraine, unspecified, not intractable, without status migrainosus: Secondary | ICD-10-CM | POA: Insufficient documentation

## 2015-10-30 DIAGNOSIS — M797 Fibromyalgia: Secondary | ICD-10-CM | POA: Insufficient documentation

## 2015-10-30 DIAGNOSIS — Z8619 Personal history of other infectious and parasitic diseases: Secondary | ICD-10-CM | POA: Insufficient documentation

## 2015-10-30 DIAGNOSIS — Z8719 Personal history of other diseases of the digestive system: Secondary | ICD-10-CM | POA: Insufficient documentation

## 2015-10-30 DIAGNOSIS — B86 Scabies: Secondary | ICD-10-CM | POA: Insufficient documentation

## 2015-10-30 DIAGNOSIS — Z8639 Personal history of other endocrine, nutritional and metabolic disease: Secondary | ICD-10-CM | POA: Insufficient documentation

## 2015-10-30 DIAGNOSIS — Z79899 Other long term (current) drug therapy: Secondary | ICD-10-CM | POA: Insufficient documentation

## 2015-10-30 MED ORDER — PERMETHRIN 5 % EX CREA: TOPICAL_CREAM | CUTANEOUS | Status: DC

## 2015-10-30 NOTE — ED Provider Notes (Signed)
CSN: [id number]     Arrival date & time 10/30/15  2113 History  By signing my name below, I, Murriel Hopper, attest that this documentation has been prepared under the direction and in the presence of [employer], PA-C.  Electronically Signed: Murriel Hopper, ED Scribe. 10/30/2015. 10:37 PM.    Chief Complaint  Patient presents with  . Pruritis      The history is provided by the patient. No language interpreter was used.   HPI Comments: Meredith Reeves is a 35 y.o. female who presents to the Emergency Department complaining of an intermittent pruritic, itching rash to her legs, hands, arms, and abdomen that has been present for about 3 months. Pt states she has taken prednisone, cortisone cream, and benadryl with no relief. Pt states she gets itching, red rashes in random areas of her body but denies any swelling of the lips or tongue.  Denies SOB.  Denies fever or chills. She denies any new soaps, detergents, medications, or lotions.  She states that her son has a similar rash.  Past Medical History  Diagnosis Date  . GERD (gastroesophageal reflux disease)   . Migraines   . Cough   . Trouble swallowing   . Staph infection   . Fibromyalgia   . Thyroid nodule    Past Surgical History  Procedure Laterality Date  . Cesarean section  1996  . Liposection  2010  . Breast reduction  2005  . Abdomnioplasty     Family History  Problem Relation Age of Onset  . Anesthesia problems Neg Hx   . Alcohol abuse Father   . Heart disease Father   . Hyperlipidemia Father    Social History  Substance Use Topics  . Smoking status: Never Smoker   . Smokeless tobacco: Never Used  . Alcohol Use: Yes     Comment: once a week   OB History    Gravida Para Term Preterm AB TAB SAB Ectopic Multiple Living   Review of Systems  Constitutional: Negative for fever and chills.  HENT: Negative for facial swelling.   Skin: Positive for color change and rash.  All other systems  reviewed and are negative.     Allergies  Vicodin  Home Medications   Prior to Admission medications   Medication Sig Start Date End Date Taking? Authorizing Provider  Cholecalciferol (VITAMIN D PO) Take 1 capsule by mouth daily.    Historical Provider, MD  cyclobenzaprine (FLEXERIL) 10 MG tablet Take 10 mg by mouth 4 (four) times daily as needed for muscle spasms.    Historical Provider, MD  diazepam (VALIUM) 5 MG tablet Take 1 tablet (5 mg total) by mouth 2 (two) times daily. Patient taking differently: Take 5 mg by mouth daily as needed for anxiety.  11/24/13   Junius Finner, PA-C  fluconazole (DIFLUCAN) 150 MG tablet Take 1 tablet (150 mg total) by mouth once. Patient not taking: Reported on 07/14/2015 07/04/15   Aviva Signs, CNM  gabapentin (NEURONTIN) 100 MG capsule Take 100 mg by mouth 3 (three) times daily as needed (pain).    Historical Provider, MD  ibuprofen (ADVIL,MOTRIN) 200 MG tablet Take 800 mg by mouth every 6 (six) hours as needed for moderate pain.     Historical Provider, MD  medroxyPROGESTERone (DEPO-PROVERA) 150 MG/ML injection Inject 150 mg into the muscle every 3 (three) months.  10/16/14   Historical Provider, MD  oxyCODONE-acetaminophen (PERCOCET/ROXICET) 5-325 MG tablet Take 1 tablet by mouth every 4 (four) hours as needed. 09/23/15   Tiffany Netta Cedars, PA-C  polycarbophil (FIBERCON) 625 MG tablet Take 1 tablet (625 mg total) by mouth daily. Patient not taking: Reported on 07/14/2015 07/04/15   Aviva Signs, CNM  polyethylene glycol [facility]) packet Take 17 g by mouth daily. Patient not taking: Reported on 09/23/2015 07/04/15   Aviva Signs, CNM  predniSONE (DELTASONE) 20 MG tablet Take 2 tablets (40 mg total) by mouth daily. Take 40 mg by mouth daily for 3 days, then  by mouth daily for 3 days, then  daily for 3 days 09/23/15   Vivianne Master, PA-C  promethazine (PHENERGAN) 25 MG tablet Take 1 tablet (25 mg total) by mouth every 6 (six) hours as needed  for nausea or vomiting. 07/14/15   Vanetta Mulders, MD   BP 140/101 mmHg  Pulse 97  Temp(Src) 99.3 F (37.4 C) (Oral)  Resp 18  Ht  (1.575 m)  Wt 185 lb (83.915 kg)  BMI 33.83 kg/m2  SpO2 100% Physical Exam  Constitutional: She is oriented to person, place, and time. She appears well-developed and well-nourished.  HENT:  Head: Normocephalic and atraumatic.  Mouth/Throat: Oropharynx is clear and moist.  No swelling of lips, tongue, or throat Oropharynx is clear and moist  Neck: Normal range of motion.  Cardiovascular: Normal rate, regular rhythm and normal heart sounds.   Pulmonary/Chest: Effort normal and breath sounds normal.  Abdominal: She exhibits no distension.  Neurological: She is alert and oriented to person, place, and time.  Skin: Skin is warm and dry.  Erythematous papular rash located on chest, periumbilical area, bilateral axilla, web spaces of the fingers, and volar aspect of the wrist.  Psychiatric: She has a normal mood and affect.  Nursing note and vitals reviewed.   ED Course  Procedures (including critical care time)  DIAGNOSTIC STUDIES: Oxygen Saturation is 100% on room air, normal by my interpretation.    COORDINATION OF CARE: 10:33 PM Discussed treatment plan with pt at bedside and pt agreed to plan.   Labs Review Labs Reviewed - No data to display  Imaging Review No results found. I have personally reviewed and evaluated these images and lab results as part of my medical decision-making.   EKG Interpretation None      MDM   Final diagnoses:  None   Patient presents today with a rash.  Due to the distribution and appearance suspect Scabies.  No signs of systemic illness.  No swelling of the lips, tongue, or throat.  Feel that the patient is stable for discharge.  Discharged home with Permethrin cream.  Return precautions given. I personally performed the services described in this documentation, which was scribed in my presence. The  recorded information has been reviewed and is accurate.    Santiago Glad, PA-C 10/31/15 0003  Gerhard Munch, MD 10/31/15 [phone]

## 2015-10-30 NOTE — Discharge Instructions (Signed)
Use cream as prescribed.  Apply cream and leave on for 12 hours and then rinse off.  You should only need one application, but can repeat in one week if needed.  Scabies, Adult Scabies is a skin condition that happens when very small insects get under the skin (infestation). This causes a rash and severe itchiness. Scabies can spread from person to person (is contagious). If you get scabies, it is common for others in your household to get scabies too. With proper treatment, symptoms usually go away in 2-4 weeks. Scabies usually does not cause lasting problems. CAUSES This condition is caused by mites (Sarcoptes scabiei, or human itch mites) that can only be seen with a microscope. The mites get into the top layer of skin and lay eggs. Scabies can spread from person to person through:  Close contact with a person who has scabies.  Contact with infested items, such as towels, bedding, or clothing. RISK FACTORS This condition is more likely to develop in:  People who live in nursing homes and other extended-care facilities.  People who have sexual contact with a partner who has scabies.  Young children who attend child care facilities.  People who care for others who are at increased risk for scabies. SYMPTOMS Symptoms of this condition may include:  Severe itchiness. This is often worse at night.  A rash that includes tiny red bumps or blisters. The rash commonly occurs on the wrist, elbow, armpit, fingers, waist, groin, or buttocks. Bumps may form a line (burrow) in some areas.  Skin irritation. This can include scaly patches or sores. DIAGNOSIS This condition is diagnosed with a physical exam. Your health care provider will look closely at your skin. In some cases, your health care provider may take a sample of your affected skin (skin scraping) and have it examined under a microscope. TREATMENT This condition may be treated with:  Medicated cream or lotion that kills the mites.  This is spread on the entire body and left on for several hours. Usually, one treatment with medicated cream or lotion is enough to kill all of the mites. In severe cases, the treatment may be repeated.  Medicated cream that relieves itching.  Medicines that help to relieve itching.  Medicines that kill the mites. This treatment is rarely used. HOME CARE INSTRUCTIONS Medicines  Take or apply over-the-counter and prescription medicines as told by your health care provider.  Apply medicated cream or lotion as told by your health care provider.  Do not wash off the medicated cream or lotion until the necessary amount of time has passed. Skin Care  Avoid scratching your affected skin.  Keep your fingernails closely trimmed to reduce injury from scratching.  Take cool baths or apply cool washcloths to help reduce itching. General Instructions  Clean all items that you recently had contact with, including bedding, clothing, and furniture. Do this on the same day that your treatment starts.  Use hot water when you wash items.  Place unwashable items into closed, airtight plastic bags for at least 3 days. The mites cannot live for more than 3 days away from human skin.  Vacuum furniture and mattresses that you use.  Make sure that other people who may have been infested are examined by a health care provider. These include members of your household and anyone who may have had contact with infested items.  Keep all follow-up visits as told by your health care provider. This is important. SEEK MEDICAL CARE IF:  You have itching that does not go away after 4 weeks of treatment.  You continue to develop new bumps or burrows.  You have redness, swelling, or pain in your rash area after treatment.  You have fluid, blood, or pus coming from your rash.   This information is not intended to replace advice given to you by your health care provider. Make sure you discuss any questions you  have with your health care provider.   Document Released: 06/15/2015 Document Reviewed: 04/26/2015 Elsevier Interactive Patient Education [employer]

## 2015-10-30 NOTE — ED Notes (Signed)
pesents with itching since November-the redness and bumps come and go-she has taken prednisone and benadryl with no relief.

## 2015-11-04 ENCOUNTER — Encounter: Payer: Self-pay | Admitting: Family Medicine

## 2015-11-04 ENCOUNTER — Ambulatory Visit: Payer: Self-pay | Attending: Family Medicine | Admitting: Family Medicine

## 2015-11-04 VITALS — BP 113/76 | HR 93 | Temp 98.7°F | Resp 16 | Ht 62.0 in | Wt 190.0 lb

## 2015-11-04 DIAGNOSIS — L299 Pruritus, unspecified: Secondary | ICD-10-CM | POA: Insufficient documentation

## 2015-11-04 DIAGNOSIS — Z0000 Encounter for general adult medical examination without abnormal findings: Secondary | ICD-10-CM

## 2015-11-04 DIAGNOSIS — B353 Tinea pedis: Secondary | ICD-10-CM

## 2015-11-04 DIAGNOSIS — B86 Scabies: Secondary | ICD-10-CM

## 2015-11-04 DIAGNOSIS — Z79899 Other long term (current) drug therapy: Secondary | ICD-10-CM | POA: Insufficient documentation

## 2015-11-04 DIAGNOSIS — R21 Rash and other nonspecific skin eruption: Secondary | ICD-10-CM | POA: Insufficient documentation

## 2015-11-04 LAB — POCT GLYCOSYLATED HEMOGLOBIN (HGB A1C): Hemoglobin A1C: 5.8

## 2015-11-04 MED ORDER — KETOCONAZOLE 2 % EX CREA: 1.0000 "application " | TOPICAL_CREAM | Freq: Every day | CUTANEOUS | Status: DC

## 2015-11-04 MED ORDER — FLUCONAZOLE 150 MG PO TABS: 150.0000 mg | ORAL_TABLET | Freq: Once | ORAL | Status: DC

## 2015-11-04 MED ORDER — HYDROXYZINE HCL 25 MG PO TABS: 25.0000 mg | ORAL_TABLET | Freq: Three times a day (TID) | ORAL | Status: DC | PRN

## 2015-11-04 NOTE — Patient Instructions (Signed)
Meredith Reeves was seen today for pruritis.  Diagnoses and all orders for this visit:  Healthcare maintenance -     POCT glycosylated hemoglobin (Hb A1C)  Scabies -     hydrOXYzine (ATARAX/VISTARIL) 25 MG tablet; Take 1 tablet (25 mg total) by mouth 3 (three) times daily as needed for itching.  Tinea pedis of both feet -     ketoconazole (NIZORAL) 2 % cream; Apply 1 application topically daily. -     fluconazole (DIFLUCAN) 150 MG tablet; Take 1 tablet (150 mg total) by mouth once.   Diabetes screen negative  meds sent to [facility]  F/u in 4-6 weeks for pap smear if you are due if you are not due please have records of last pap sent to our clinic  Dr. Armen Pickup

## 2015-11-04 NOTE — Progress Notes (Signed)
HFU scabies  Itching unable to sleep due to it  No tobacco user  Pain scale #1 No suicidal thoughts in the past two weeks

## 2015-11-04 NOTE — Progress Notes (Signed)
Subjective:  Patient ID: Meredith Reeves, female    DOB: October 14, 1980  Age: 35 y.o. MRN: [id number]  CC: Pruritis   HPI Meredith Reeves presents for     1. Pruritus: she was diagnosed with scabies 5 days ago in the ED. She was prescribed permethrin cream. She took one dose, the next dose is due in 2 days. She reports severe itching especially at night. She is most itchy at wrist and anterior abdomen.   2. athletes foot: rash with peeling skin around toes of both feet x one month. This area is also itching.  Requesting cream.  3. HM: reports last pap done by gyn < 3 years ago.   Social History  Substance Use Topics  . Smoking status: Never Smoker   . Smokeless tobacco: Never Used  . Alcohol Use: Yes     Comment: once a week   Outpatient Prescriptions Prior to Visit  Medication Sig Dispense Refill  . Cholecalciferol (VITAMIN D PO) Take 1 capsule by mouth daily.    . cyclobenzaprine (FLEXERIL) 10 MG tablet Take 10 mg by mouth 4 (four) times daily as needed for muscle spasms.    . diazepam (VALIUM) 5 MG tablet Take 1 tablet (5 mg total) by mouth 2 (two) times daily. (Patient taking differently: Take 5 mg by mouth daily as needed for anxiety. ) 10 tablet 0  . fluconazole (DIFLUCAN) 150 MG tablet Take 1 tablet (150 mg total) by mouth once. (Patient not taking: Reported on 07/14/2015) 1 tablet 3  . gabapentin (NEURONTIN) 100 MG capsule Take 100 mg by mouth 3 (three) times daily as needed (pain).    Marland Kitchen ibuprofen (ADVIL,MOTRIN) 200 MG tablet Take 800 mg by mouth every 6 (six) hours as needed for moderate pain.     . medroxyPROGESTERone (DEPO-PROVERA) 150 MG/ML injection Inject 150 mg into the muscle every 3 (three) months.   4  . oxyCODONE-acetaminophen (PERCOCET/ROXICET) 5-325 MG tablet Take 1 tablet by mouth every 4 (four) hours as needed. 20 tablet 0  . permethrin (ELIMITE) 5 % cream Apply to affected area once 60 g 0  . polycarbophil (FIBERCON) 625 MG tablet Take 1 tablet (625 mg total)  by mouth daily. (Patient not taking: Reported on 07/14/2015) 30 tablet 1  . polyethylene glycol (MIRALAX) packet Take 17 g by mouth daily. (Patient not taking: Reported on 09/23/2015) 14 each 1  . predniSONE (DELTASONE) 20 MG tablet Take 2 tablets (40 mg total) by mouth daily. Take 40 mg by mouth daily for 3 days, then  by mouth daily for 3 days, then  daily for 3 days 12 tablet 0  . promethazine (PHENERGAN) 25 MG tablet Take 1 tablet (25 mg total) by mouth every 6 (six) hours as needed for nausea or vomiting. 12 tablet 1   No facility-administered medications prior to visit.    ROS Review of Systems  Constitutional: Positive for fatigue. Negative for fever and chills.  Eyes: Negative for visual disturbance.  Respiratory: Negative for shortness of breath.   Cardiovascular: Negative for chest pain.  Gastrointestinal: Negative for abdominal pain and blood in stool.  Musculoskeletal: Negative for back pain and arthralgias.  Skin: Positive for rash.  Allergic/Immunologic: Negative for immunocompromised state.  Hematological: Negative for adenopathy. Does not bruise/bleed easily.  Psychiatric/Behavioral: Positive for sleep disturbance. Negative for suicidal ideas and dysphoric mood.    Objective:  BP 113/76 mmHg  Pulse 93  Temp(Src) 98.7 F (37.1 C) (Oral)  Resp 16  Ht   (1.575 m)  Wt 190 lb (86.183 kg)  BMI 34.74 kg/m2  SpO2 100%  BP/Weight 11/04/2015 10/30/2015 09/23/2015  Systolic BP 113 140 119  Diastolic BP 76 101 83  Wt. (Lbs) 190 185 192  BMI 34.74 33.83 35.11    Physical Exam  Constitutional: She is oriented to person, place, and time. She appears well-developed and well-nourished. No distress.  HENT:  Head: Normocephalic and atraumatic.  Cardiovascular: Normal rate, regular rhythm, normal heart sounds and intact distal pulses.   Pulmonary/Chest: Effort normal and breath sounds normal.  Musculoskeletal: She exhibits no edema.  Neurological: She is alert and  oriented to person, place, and time.  Skin: Skin is warm and dry. Rash noted.     Psychiatric: She has a normal mood and affect.    Lab Results  Component Value Date   HGBA1C 5.80 11/04/2015   Assessment & Plan:   Problem List Items Addressed This Visit    Scabies   Relevant Medications   hydrOXYzine (ATARAX/VISTARIL) 25 MG tablet    Other Visit Diagnoses    Healthcare maintenance    -  Primary    Relevant Orders    POCT glycosylated hemoglobin (Hb A1C) (Completed)    Tinea pedis of both feet        Relevant Medications    ketoconazole (NIZORAL) 2 % cream    fluconazole (DIFLUCAN) 150 MG tablet       No orders of the defined types were placed in this encounter.    Follow-up: No Follow-up on file.   Dessa Phi MD

## 2016-05-17 ENCOUNTER — Encounter (HOSPITAL_COMMUNITY): Payer: Self-pay

## 2016-05-17 ENCOUNTER — Inpatient Hospital Stay (HOSPITAL_COMMUNITY)
Admission: AD | Admit: 2016-05-17 | Discharge: 2016-05-17 | Disposition: A | Discharge status: Home / Self Care | Payer: 59 | Source: Ambulatory Visit | Attending: Obstetrics & Gynecology | Admitting: Obstetrics & Gynecology

## 2016-05-17 DIAGNOSIS — B373 Candidiasis of vulva and vagina: Secondary | ICD-10-CM | POA: Diagnosis not present

## 2016-05-17 DIAGNOSIS — N760 Acute vaginitis: Secondary | ICD-10-CM | POA: Insufficient documentation

## 2016-05-17 DIAGNOSIS — K219 Gastro-esophageal reflux disease without esophagitis: Secondary | ICD-10-CM | POA: Diagnosis not present

## 2016-05-17 DIAGNOSIS — Z79899 Other long term (current) drug therapy: Secondary | ICD-10-CM | POA: Diagnosis not present

## 2016-05-17 DIAGNOSIS — Z87898 Personal history of other specified conditions: Secondary | ICD-10-CM

## 2016-05-17 DIAGNOSIS — M797 Fibromyalgia: Secondary | ICD-10-CM | POA: Diagnosis not present

## 2016-05-17 DIAGNOSIS — R1030 Lower abdominal pain, unspecified: Secondary | ICD-10-CM | POA: Diagnosis present

## 2016-05-17 DIAGNOSIS — B3731 Acute candidiasis of vulva and vagina: Secondary | ICD-10-CM

## 2016-05-17 DIAGNOSIS — A6000 Herpesviral infection of urogenital system, unspecified: Secondary | ICD-10-CM

## 2016-05-17 LAB — WET PREP, GENITAL
Clue Cells Wet Prep HPF POC: NONE SEEN
Sperm: NONE SEEN
Trich, Wet Prep: NONE SEEN

## 2016-05-17 LAB — CBC WITH DIFFERENTIAL/PLATELET
Basophils Absolute: 0 10*3/uL (ref 0.0–0.1)
Basophils Relative: 0 %
Eosinophils Absolute: 0.1 10*3/uL (ref 0.0–0.7)
Eosinophils Relative: 1 %
HEMATOCRIT: 37.2 % (ref 36.0–46.0)
Hemoglobin: 11.8 g/dL — ABNORMAL LOW (ref 12.0–15.0)
LYMPHS ABS: 4.8 10*3/uL — AB (ref 0.7–4.0)
LYMPHS PCT: 68 %
MCH: 22.6 pg — ABNORMAL LOW (ref 26.0–34.0)
MCHC: 31.7 g/dL (ref 30.0–36.0)
MCV: 71.3 fL — AB (ref 78.0–100.0)
MONO ABS: 0.3 10*3/uL (ref 0.1–1.0)
MONOS PCT: 4 %
NEUTROS ABS: 1.9 10*3/uL (ref 1.7–7.7)
Neutrophils Relative %: 27 %
Platelets: 278 10*3/uL (ref 150–400)
RBC: 5.22 MIL/uL — ABNORMAL HIGH (ref 3.87–5.11)
RDW: 14.3 % (ref 11.5–15.5)
WBC: 7.1 10*3/uL (ref 4.0–10.5)

## 2016-05-17 LAB — URINE MICROSCOPIC-ADD ON: RBC / HPF: NONE SEEN RBC/hpf (ref 0–5)

## 2016-05-17 LAB — URINALYSIS, ROUTINE W REFLEX MICROSCOPIC
Bilirubin Urine: NEGATIVE
GLUCOSE, UA: NEGATIVE mg/dL
Hgb urine dipstick: NEGATIVE
Ketones, ur: NEGATIVE mg/dL
Nitrite: NEGATIVE
PH: 7.5 (ref 5.0–8.0)
Protein, ur: NEGATIVE mg/dL
Specific Gravity, Urine: 1.025 (ref 1.005–1.030)

## 2016-05-17 LAB — POCT PREGNANCY, URINE: Preg Test, Ur: NEGATIVE

## 2016-05-17 MED ORDER — ACYCLOVIR 400 MG PO TABS: 800.0000 mg | ORAL_TABLET | Freq: Two times a day (BID) | ORAL | 0 refills | Status: DC

## 2016-05-17 MED ORDER — FLUCONAZOLE 200 MG PO TABS: 200.0000 mg | ORAL_TABLET | Freq: Every day | ORAL | 0 refills | Status: DC

## 2016-05-17 NOTE — MAU Note (Signed)
Having pain in lower abd, started last wk.  Also having a d/c- started last week.  Frequency of urination, denies pain with urination.

## 2016-05-17 NOTE — MAU Provider Note (Signed)
[facility]    Provider Note   CSN: [id number] Arrival date & time: 05/17/16  1727  First Provider Contact:  None       History   Chief Complaint Chief Complaint  Patient presents with  . Abdominal Pain  . Vaginal Discharge    HPI Meredith Reeves is a 35 y.o. female with current HSV outbreak and vaginal pain and itching.   Abdominal Pain  This is a new problem. The current episode started in the past 7 days. The onset quality is gradual. The problem occurs intermittently. The problem has been gradually worsening. Pain location: lower abdomen. The pain is at a severity of 5/10. The quality of the pain is sharp. The abdominal pain does not radiate. Associated symptoms include flatus. Pertinent negatives include no anorexia, constipation, diarrhea, dysuria, fever, frequency, hematuria, nausea, vomiting or weight loss. Nothing aggravates the pain. The pain is relieved by nothing. She has tried nothing for the symptoms. Her past medical history is significant for GERD.  Vaginal Discharge  The patient's primary symptoms include pelvic pain and vaginal discharge. Associated symptoms include abdominal pain and urgency. Pertinent negatives include no anorexia, back pain, chills, constipation, diarrhea, dysuria, fever, frequency, hematuria, nausea, rash or vomiting.   Patient reports current HSV outbreak.  Past Medical History:  Diagnosis Date  . Cough   . Fibromyalgia Dx 2012  . GERD (gastroesophageal reflux disease) Dx 2004  . Migraines Dx 2012  . Staph infection   . Thyroid nodule   . Trouble swallowing     Patient Active Problem List   Diagnosis Date Noted  . Scabies 11/04/2015  . 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 abdomnioplasty    . breast reduction  2005  . CESAREAN SECTION  1996  . liposection  2010    OB History    Gravida Para Term Preterm AB Living   1 1   1   1    SAB TAB Ectopic  Multiple Live Births           1       Home Medications    Prior to Admission medications   Medication Sig Start Date End Date Taking? Authorizing Provider  cyclobenzaprine (FLEXERIL) 10 MG tablet Take 10 mg by mouth 2 (two) times daily as needed for muscle spasms.    Yes Historical Provider, MD  diazepam (VALIUM) 5 MG tablet Take 1 tablet (5 mg total) by mouth 2 (two) times daily. Patient taking differently: Take 5 mg by mouth daily as needed for anxiety.  11/24/13  Yes Junius FinnerErin O'Malley, PA-C  hydrOXYzine (ATARAX/VISTARIL) 25 MG tablet Take 1 tablet (25 mg total) by mouth 3 (three) times daily as needed for itching. 11/04/15  Yes Josalyn Funches, MD  medroxyPROGESTERone (DEPO-PROVERA) 150 MG/ML injection Inject 150 mg into the muscle every 3 (three) months.  10/16/14  Yes Historical Provider, MD  acyclovir (ZOVIRAX) 400 MG tablet Take 2 tablets (800 mg total) by mouth 2 (two) times daily. 05/17/16   Zaeda Mcferran Orlene OchM Vianey Caniglia, NP  fluconazole (DIFLUCAN) 200 MG tablet Take 1 tablet (200 mg total) by mouth daily. 05/17/16   Araeya Lamb Orlene OchM Genesia Caslin, NP    Family History Family History  Problem Relation Age of Onset  . Alcohol abuse Father   . Heart disease Father   . Hyperlipidemia Father   . Anesthesia problems Neg Hx     Social History Social History  Substance Use  Topics  . Smoking status: Never Smoker  . Smokeless tobacco: Never Used  . Alcohol use Yes     Comment: once a week     Allergies   Vicodin [hydrocodone-acetaminophen]   Review of Systems Review of Systems  Constitutional: Negative for chills, fever and weight loss.  HENT: Negative.   Eyes: Negative for visual disturbance.  Respiratory: Negative for cough, chest tightness and shortness of breath.   Cardiovascular: Negative for chest pain, palpitations and leg swelling.  Gastrointestinal: Positive for abdominal pain and flatus. Negative for anal bleeding, anorexia, constipation, diarrhea, nausea and vomiting.  Genitourinary: Positive for  pelvic pain, urgency and vaginal discharge. Negative for dysuria, frequency, hematuria and vaginal bleeding.  Musculoskeletal: Negative for back pain and neck stiffness.  Skin: Negative for rash and wound.  Hematological: Negative for adenopathy.  Psychiatric/Behavioral: Negative for confusion. The patient is not nervous/anxious.      Physical Exam Updated Vital Signs BP 137/90 (BP Location: Right Arm)   Pulse 85   Temp 98.8 F (37.1 C) (Oral)   Resp 18   Wt 197 lb 9.6 oz (89.6 kg)   BMI 36.14 kg/m   Physical Exam  Constitutional: She is oriented to person, place, and time. She appears well-developed and well-nourished. No distress.  HENT:  Head: Normocephalic and atraumatic.  Eyes: EOM are normal.  Neck: Normal range of motion. Neck supple.  Cardiovascular: Normal rate and regular rhythm.   No murmur heard. Pulmonary/Chest: Effort normal and breath sounds normal. No respiratory distress.  Abdominal: Soft. Bowel sounds are normal. She exhibits no distension. There is no tenderness.  Unable to reproduce the pain that the patient reports comes and goes.  Scars noted from c/s and abdominal surgery for "Tummy Tuck".   Musculoskeletal: Normal range of motion.  Neurological: She is alert and oriented to person, place, and time. No cranial nerve deficit.  Skin: Skin is warm and dry.  Psychiatric: She has a normal mood and affect. Her behavior is normal.  Nursing note and vitals reviewed. pelvic exam: external genitalia with few vesicular lesions c/w HSV. White d/c vaginal vault, vaginal irritation. Cervix without lesions, no CMT, no adnexal tenderness. Unable to palpate uterus due to patient habitus.    ED Treatments / Results  Labs (all labs ordered are listed, but only abnormal results are displayed) Labs Reviewed  WET PREP, GENITAL - Abnormal; Notable for the following:       Result Value   Yeast Wet Prep HPF POC PRESENT (*)    WBC, Wet Prep HPF POC FEW (*)    All other  components within normal limits  URINALYSIS, ROUTINE W REFLEX MICROSCOPIC (NOT AT [facility]) - Abnormal; Notable for the following:    Leukocytes, UA TRACE (*)    All other components within normal limits  URINE MICROSCOPIC-ADD ON - Abnormal; Notable for the following:    Squamous Epithelial / LPF 0-5 (*)    Bacteria, UA FEW (*)    All other components within normal limits  CBC WITH DIFFERENTIAL/PLATELET - Abnormal; Notable for the following:    RBC 5.22 (*)    Hemoglobin 11.8 (*)    MCV 71.3 (*)    MCH 22.6 (*)    Lymphs Abs 4.8 (*)    All other components within normal limits  RPR  HIV ANTIBODY (ROUTINE TESTING)  POCT PREGNANCY, URINE  GC/CHLAMYDIA PROBE AMP ([place]) NOT AT [facility]    Radiology No results found.  Procedures Procedures (including critical care time)  Medications Ordered in ED Medications - No data to display   Initial Impression / Assessment and Plan / ED Course  I have reviewed the triage vital signs and the nursing notes.  Pertinent labs & imaging results that were available during my care of the patient were reviewed by me and considered in my medical decision making (see chart for details).  Clinical Course  Discussed with the patient and all questioned fully answered. She will return if any problems arise.   Final Clinical Impressions(s) / ED Diagnoses  35 y.o. female with vaginal itching and lesions stable for d/c without fever, difficulty voiding and does not appear toxic. She will f/u with her PCP or return as needed for problems.   Final diagnoses:  Monilial vaginitis  Genital herpes  History of abdominal pain    New Prescriptions Discharge Medication List as of 05/17/2016  8:11 PM    START taking these medications   Details  acyclovir (ZOVIRAX) 400 MG tablet Take 2 tablets (800 mg total) by mouth 2 (two) times daily., Starting Thu 05/17/2016, Normal    fluconazole (DIFLUCAN) 200 MG tablet Take 1 tablet (200 mg total) by mouth daily.,  Starting Thu 05/17/2016, Normal

## 2016-05-18 LAB — GC/CHLAMYDIA PROBE AMP (~~LOC~~) NOT AT ARMC
CHLAMYDIA, DNA PROBE: NEGATIVE
Neisseria Gonorrhea: NEGATIVE

## 2016-05-18 LAB — HIV ANTIBODY (ROUTINE TESTING W REFLEX): HIV Screen 4th Generation wRfx: NONREACTIVE

## 2016-05-18 LAB — SYPHILIS: RPR W/REFLEX TO RPR TITER AND TREPONEMAL ANTIBODIES, TRADITIONAL SCREENING AND DIAGNOSIS ALGORITHM: RPR Ser Ql: NONREACTIVE

## 2016-07-01 ENCOUNTER — Encounter: Payer: Self-pay | Admitting: *Deleted

## 2016-11-19 ENCOUNTER — Emergency Department (HOSPITAL_COMMUNITY)
Admission: EM | Admit: 2016-11-19 | Discharge: 2016-11-19 | Disposition: A | Discharge status: Home / Self Care | Payer: 59 | Attending: Dermatology | Admitting: Dermatology

## 2016-11-19 ENCOUNTER — Ambulatory Visit (HOSPITAL_COMMUNITY): Admission: EM | Admit: 2016-11-19 | Discharge: 2016-11-19 | Discharge status: Against Medical Advice | Payer: 59

## 2016-11-19 ENCOUNTER — Encounter (HOSPITAL_COMMUNITY): Payer: Self-pay | Admitting: Emergency Medicine

## 2016-11-19 DIAGNOSIS — R52 Pain, unspecified: Secondary | ICD-10-CM | POA: Insufficient documentation

## 2016-11-19 DIAGNOSIS — Z79899 Other long term (current) drug therapy: Secondary | ICD-10-CM | POA: Insufficient documentation

## 2016-11-19 DIAGNOSIS — Z5321 Procedure and treatment not carried out due to patient leaving prior to being seen by health care provider: Secondary | ICD-10-CM | POA: Diagnosis not present

## 2016-11-19 NOTE — ED Triage Notes (Signed)
Pt sts body aches and HA upon waking with light sensitivity

## 2017-01-18 ENCOUNTER — Encounter (HOSPITAL_COMMUNITY): Payer: Self-pay | Admitting: *Deleted

## 2017-01-18 ENCOUNTER — Emergency Department (HOSPITAL_COMMUNITY)
Admission: EM | Admit: 2017-01-18 | Discharge: 2017-01-18 | Disposition: A | Discharge status: Home / Self Care | Payer: 59 | Attending: Emergency Medicine | Admitting: Emergency Medicine

## 2017-01-18 DIAGNOSIS — R6884 Jaw pain: Secondary | ICD-10-CM | POA: Insufficient documentation

## 2017-01-18 DIAGNOSIS — R51 Headache: Secondary | ICD-10-CM

## 2017-01-18 DIAGNOSIS — H9201 Otalgia, right ear: Secondary | ICD-10-CM

## 2017-01-18 DIAGNOSIS — R519 Headache, unspecified: Secondary | ICD-10-CM

## 2017-01-18 DIAGNOSIS — Z79899 Other long term (current) drug therapy: Secondary | ICD-10-CM | POA: Diagnosis not present

## 2017-01-18 MED ORDER — FLUCONAZOLE 200 MG PO TABS: 200.0000 mg | ORAL_TABLET | Freq: Every day | ORAL | 0 refills | Status: DC

## 2017-01-18 MED ORDER — TRAMADOL HCL 50 MG PO TABS: 50.0000 mg | ORAL_TABLET | Freq: Four times a day (QID) | ORAL | 0 refills | Status: DC | PRN

## 2017-01-18 MED ORDER — OXYCODONE-ACETAMINOPHEN 5-325 MG PO TABS: 1.0000 | ORAL_TABLET | Freq: Four times a day (QID) | ORAL | 0 refills | Status: DC | PRN

## 2017-01-18 MED ORDER — CLINDAMYCIN HCL 300 MG PO CAPS: 300.0000 mg | ORAL_CAPSULE | Freq: Three times a day (TID) | ORAL | 0 refills | Status: DC

## 2017-01-18 MED ORDER — PREDNISONE 50 MG PO TABS: 50.0000 mg | ORAL_TABLET | Freq: Every day | ORAL | 0 refills | Status: DC

## 2017-01-18 MED ORDER — CLINDAMYCIN HCL 300 MG PO CAPS: 300.0000 mg | ORAL_CAPSULE | Freq: Three times a day (TID) | ORAL | 0 refills | Status: AC

## 2017-01-18 NOTE — ED Triage Notes (Signed)
Pt states for about two months she has had pain in the right ear and around the right ear. She has seen her PCP for the same previously. No meds PTA. No fevers

## 2017-01-18 NOTE — Discharge Instructions (Signed)
Return here as needed. Follow up with the ENT provided.

## 2017-01-19 MED ORDER — FLUCONAZOLE 200 MG PO TABS: 200.0000 mg | ORAL_TABLET | Freq: Every day | ORAL | 0 refills | Status: DC

## 2017-01-19 MED ORDER — OXYCODONE-ACETAMINOPHEN 5-325 MG PO TABS: 1.0000 | ORAL_TABLET | Freq: Four times a day (QID) | ORAL | 0 refills | Status: DC | PRN

## 2017-01-19 NOTE — ED Provider Notes (Signed)
Patient was seen yesterday by Cristal Deer lawyer. The pharmacy states that his CEA number has expired so patient was rewritten the prescription that was prescribed yesterday for Percocet. This was for 6 tablets.   Rolan Bucco, MD 01/19/17 1007

## 2017-01-25 NOTE — ED Provider Notes (Signed)
[facility] Provider Note   CSN: [id number] Arrival date & time: 01/18/17  2148     History   Chief Complaint Chief Complaint  Patient presents with  . Otalgia    HPI Meredith Reeves is a 36 y.o. female.  HPI Patient presents to the emergency department with a two-month history of ear pain.  The patient is also complaining of pain in her jaw.  States movement and palpation.  The areas makes the pain worse.  Patient states she did not take any medications prior to arrival.  She states she was seen previously for this and given medications, which did not seem to help take she seen her primary doctor, states she does not have an ear infection. The patient denies chest pain, shortness of breath, headache,blurred vision, neck pain, fever, cough, weakness, numbness, dizziness, anorexia, edema, abdominal pain, nausea, vomiting, diarrhea, rash, back pain, dysuria, hematemesis, bloody stool, near syncope, or syncope. Past Medical History:  Diagnosis Date  . Cough   . Fibromyalgia Dx 2012  . GERD (gastroesophageal reflux disease) Dx 2004  . Migraines Dx 2012  . Staph infection   . Thyroid nodule   . Trouble swallowing     Patient Active Problem List   Diagnosis Date Noted  . Scabies 11/04/2015  . 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 abdomnioplasty    . breast reduction  2005  . CESAREAN SECTION  1996  . liposection  2010    OB History    Gravida Para Term Preterm AB Living   SAB TAB Ectopic Multiple Live Births           1       Home Medications    Prior to Admission medications   Medication Sig Start Date End Date Taking? Authorizing Provider  acyclovir (ZOVIRAX) 400 MG tablet Take 2 tablets (800 mg total) by mouth 2 (two) times daily. 05/17/16   Hope Orlene Och, NP  clindamycin (CLEOCIN) 300 MG capsule Take 1 capsule (300 mg total) by mouth 3 (three) times daily. 01/18/17 01/28/17   Charlestine Night, PA-C  cyclobenzaprine (FLEXERIL) 10 MG tablet Take 10 mg by mouth 2 (two) times daily as needed for muscle spasms.     Historical Provider, MD  diazepam (VALIUM) 5 MG tablet Take 1 tablet (5 mg total) by mouth 2 (two) times daily. Patient taking differently: Take 5 mg by mouth daily as needed for anxiety.  11/24/13   Junius Finner, PA-C  fluconazole (DIFLUCAN) 200 MG tablet Take 1 tablet (200 mg total) by mouth daily. 01/19/17   Rolan Bucco, MD  hydrOXYzine (ATARAX/VISTARIL) 25 MG tablet Take 1 tablet (25 mg total) by mouth 3 (three) times daily as needed for itching. 11/04/15   Josalyn Funches, MD  medroxyPROGESTERone (DEPO-PROVERA) 150 MG/ML injection Inject 150 mg into the muscle every 3 (three) months.  10/16/14   Historical Provider, MD  oxyCODONE-acetaminophen (PERCOCET/ROXICET) 5-325 MG tablet Take 1 tablet by mouth every 6 (six) hours as needed for severe pain. 01/19/17   Rolan Bucco, MD  predniSONE (DELTASONE) 50 MG tablet Take 1 tablet (50 mg total) by mouth daily with breakfast. 01/18/17   Charlestine Night, PA-C    Family History Family History  Problem Relation Age of Onset  . Alcohol abuse Father   . Heart disease Father   . Hyperlipidemia Father   . Anesthesia problems Neg  Hx     Social History Social History  Substance Use Topics  . Smoking status: Never Smoker  . Smokeless tobacco: Never Used  . Alcohol use Yes     Comment: once a week     Allergies   Vicodin [hydrocodone-acetaminophen]   Review of Systems Review of Systems All other systems negative except as documented in the HPI. All pertinent positives and negatives as reviewed in the HPI. Physical Exam Updated Vital Signs BP 109/72 (BP Location: Right Arm)   Pulse 97   Temp 99.1 F (37.3 C)   Resp 20   SpO2 100%   Physical Exam  Constitutional: She is oriented to person, place, and time. She appears well-developed and well-nourished. No distress.  HENT:  Head: Normocephalic and  atraumatic.  Mouth/Throat: Oropharynx is clear and moist.    Eyes: Pupils are equal, round, and reactive to light.  Neck: Normal range of motion. Neck supple.  Cardiovascular: Normal rate, regular rhythm and normal heart sounds.  Exam reveals no gallop and no friction rub.   No murmur heard. Pulmonary/Chest: Effort normal and breath sounds normal. No respiratory distress. She has no wheezes.  Abdominal: Soft. Bowel sounds are normal. She exhibits no distension. There is no tenderness.  Neurological: She is alert and oriented to person, place, and time. She exhibits normal muscle tone. Coordination normal.  Skin: Skin is warm and dry. Capillary refill takes less than 2 seconds. No rash noted. No erythema.  Psychiatric: She has a normal mood and affect. Her behavior is normal.  Nursing note and vitals reviewed.    ED Treatments / Results  Labs (all labs ordered are listed, but only abnormal results are displayed) Labs Reviewed - No data to display  EKG  EKG Interpretation None       Radiology No results found.  Procedures Procedures (including critical care time)  Medications Ordered in ED Medications - No data to display   Initial Impression / Assessment and Plan / ED Course  I have reviewed the triage vital signs and the nursing notes.  Pertinent labs & imaging results that were available during my care of the patient were reviewed by me and considered in my medical decision making (see chart for details).    The patient does have pain in her jaw and mouth.  I feel like we should treat this to see if this is causing her ear pain is advised the plan and did advise her we will have her follow-up with dentistry along with her primary care doctor.  I did give him ENT follow-up if this does not seem to improve her symptoms.  Patient agrees the plan and all questions were answered    Final Clinical Impressions(s) / ED Diagnoses   Final diagnoses:  Facial pain  Otalgia of  right ear  Jaw pain    New Prescriptions Discharge Medication List as of 01/18/2017 11:16 PM    START taking these medications   Details  predniSONE (DELTASONE) 50 MG tablet Take 1 tablet (50 mg total) by mouth daily with breakfast., Starting Fri 01/18/2017, Print    clindamycin (CLEOCIN) 300 MG capsule Take 1 capsule (300 mg total) by mouth 3 (three) times daily., Starting Fri 01/18/2017, Until Mon 01/28/2017, Print    traMADol (ULTRAM) 50 MG tablet Take 1 tablet (50 mg total) by mouth every 6 (six) hours as needed for severe pain., Starting Fri 01/18/2017, Print         [employer], PA-C 01/25/17 0127  Lyndal Pulley, MD 01/25/17 [phone]

## 2017-07-13 ENCOUNTER — Encounter (HOSPITAL_COMMUNITY): Payer: Self-pay | Admitting: Emergency Medicine

## 2017-07-13 ENCOUNTER — Emergency Department (HOSPITAL_COMMUNITY): Payer: 59

## 2017-07-13 ENCOUNTER — Emergency Department (HOSPITAL_COMMUNITY)
Admission: EM | Admit: 2017-07-13 | Discharge: 2017-07-13 | Disposition: A | Discharge status: Home / Self Care | Payer: 59 | Attending: Emergency Medicine | Admitting: Emergency Medicine

## 2017-07-13 DIAGNOSIS — Z79899 Other long term (current) drug therapy: Secondary | ICD-10-CM | POA: Insufficient documentation

## 2017-07-13 DIAGNOSIS — J069 Acute upper respiratory infection, unspecified: Secondary | ICD-10-CM | POA: Insufficient documentation

## 2017-07-13 DIAGNOSIS — R05 Cough: Secondary | ICD-10-CM | POA: Diagnosis present

## 2017-07-13 MED ORDER — FLUTICASONE PROPIONATE 50 MCG/ACT NA SUSP: 2.0000 | Freq: Every day | NASAL | 0 refills | Status: AC

## 2017-07-13 MED ORDER — BENZONATATE 100 MG PO CAPS: 100.0000 mg | ORAL_CAPSULE | Freq: Three times a day (TID) | ORAL | 0 refills | Status: DC

## 2017-07-13 NOTE — Discharge Instructions (Signed)
Medications: tessalon perles for cough, Sudafed and Flonase for nasal congestion, Tylenol or ibuprofen as needed for pain Chest x-ray shows no pneumonia, this is likely a viral illness, if symptoms are not improving please follow-up with your primary care provider.

## 2017-07-13 NOTE — ED Notes (Signed)
Pt very upset that she was placed in the hallway. Pt stated she wanted to file a complaint. This Clinical research associate explained to pt that it was to help her be seen quickly and get her help sooner.

## 2017-07-13 NOTE — ED Triage Notes (Signed)
Pt c/o flu like symptoms: cough, body aches and headache x 1 week. Pt has tried theraflu x 3 days without relief. Pt has tried OTC cough medicine,  honey and lemon with no relief.

## 2017-07-13 NOTE — ED Provider Notes (Signed)
[facility]-EMERGENCY DEPT Provider Note   CSN: [id number] Arrival date & time: 07/13/17  1556     History   Chief Complaint Chief Complaint  Patient presents with  . Cough  . Generalized Body Aches  . Headache    HPI  Meredith Reeves is a 36 y.o. female with a history of GERD and migraines, presents complaining of 5 days of nasal congestion, sore scratchy throat, cough, and sinus headache. Patient reports the symptoms have been constant and getting progressively worse over the past 5 days. Patient has tried TheraFlu and some homeopathic remedies, and cough drops with no improvement. Cough occasionally productive. No fevers or chills at home. Patient denies any shortness of breath or pleuritic chest pain, denies nausea, vomiting or abdominal pain.       Past Medical History:  Diagnosis Date  . Cough   . Fibromyalgia Dx 2012  . GERD (gastroesophageal reflux disease) Dx 2004  . Migraines Dx 2012  . Staph infection   . Thyroid nodule   . Trouble swallowing     Patient Active Problem List   Diagnosis Date Noted  . Scabies 11/04/2015  . 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 abdomnioplasty    . breast reduction  2005  . CESAREAN SECTION  1996  . liposection  2010    OB History    Gravida Para Term Preterm AB Living   SAB TAB Ectopic Multiple Live Births           1       Home Medications    Prior to Admission medications   Medication Sig Start Date End Date Taking? Authorizing Provider  acyclovir (ZOVIRAX) 400 MG tablet Take 2 tablets (800 mg total) by mouth 2 (two) times daily. 05/17/16   Janne Napoleon, NP  benzonatate (TESSALON) 100 MG capsule Take 1 capsule (100 mg total) by mouth every 8 (eight) hours. 07/13/17   Dartha Lodge, PA-C  cyclobenzaprine (FLEXERIL) 10 MG tablet Take 10 mg by mouth 2 (two) times daily as needed for muscle spasms.     [provider]  diazepam  (VALIUM) 5 MG tablet Take 1 tablet (5 mg total) by mouth 2 (two) times daily. Patient taking differently: Take 5 mg by mouth daily as needed for anxiety.  11/24/13   Lurene Shadow, PA-C  fluconazole (DIFLUCAN) 200 MG tablet Take 1 tablet (200 mg total) by mouth daily. 01/19/17   Rolan Bucco, MD  fluticasone (FLONASE) 50 MCG/ACT nasal spray Place 2 sprays into both nostrils daily. 07/13/17   Dartha Lodge, PA-C  hydrOXYzine (ATARAX/VISTARIL) 25 MG tablet Take 1 tablet (25 mg total) by mouth 3 (three) times daily as needed for itching. 11/04/15   Funches, Gerilyn Nestle, MD  medroxyPROGESTERone (DEPO-PROVERA) 150 MG/ML injection Inject 150 mg into the muscle every 3 (three) months.  10/16/14   [provider]  oxyCODONE-acetaminophen (PERCOCET/ROXICET) 5-325 MG tablet Take 1 tablet by mouth every 6 (six) hours as needed for severe pain. 01/19/17   Rolan Bucco, MD  predniSONE (DELTASONE) 50 MG tablet Take 1 tablet (50 mg total) by mouth daily with breakfast. 01/18/17   Charlestine Night, PA-C    Family History Family History  Problem Relation Age of Onset  . Alcohol abuse Father   . Heart disease Father   . Hyperlipidemia Father   . Anesthesia problems Neg Hx  Social History Social History  Substance Use Topics  . Smoking status: Never Smoker  . Smokeless tobacco: Never Used  . Alcohol use Yes     Comment: socially     Allergies   Ibuprofen and Vicodin [hydrocodone-acetaminophen]   Review of Systems Review of Systems  Constitutional: Negative for chills and fever.  HENT: Positive for congestion, ear pain, postnasal drip and sore throat. Negative for trouble swallowing and voice change.   Eyes: Negative for pain and discharge.  Respiratory: Positive for cough. Negative for chest tightness and shortness of breath.   Cardiovascular: Negative for chest pain and palpitations.  Gastrointestinal: Negative for abdominal pain, nausea and vomiting.  Musculoskeletal: Negative for  neck pain and neck stiffness.  Skin: Negative for rash.     Physical Exam Updated Vital Signs BP 121/89 (BP Location: Left Arm)   Pulse 91   Temp 98.5 F (36.9 C) (Oral)   Resp 16   Ht  (1.575 m)   Wt 83.9 kg (185 lb)   SpO2 100%   BMI 33.84 kg/m   Physical Exam  Constitutional: She appears well-developed and well-nourished. No distress.  HENT:  Head: Normocephalic and atraumatic.  TMs clear with good landmarks, moderate nasal mucosa edema with clear rhinorrhea, posterior oropharynx clear with some erythema, no edema or tonsillar exudates.  Eyes: Right eye exhibits no discharge. Left eye exhibits no discharge.  Neck: Neck supple.  Cardiovascular: Normal rate, regular rhythm, normal heart sounds and intact distal pulses.   Pulmonary/Chest: Effort normal and breath sounds normal. No respiratory distress. She has no wheezes. She has no rales.  Abdominal: Soft. Bowel sounds are normal. She exhibits no distension. There is no tenderness. There is no guarding.  Neurological: She is alert. Coordination normal.  Skin: Skin is warm and dry. Capillary refill takes less than 2 seconds. She is not diaphoretic.  Psychiatric: She has a normal mood and affect. Her behavior is normal.  Nursing note and vitals reviewed.    ED Treatments / Results  Labs (all labs ordered are listed, but only abnormal results are displayed) Labs Reviewed - No data to display  EKG  EKG Interpretation None       Radiology Dg Chest 2 View  Result Date: 07/13/2017 CLINICAL DATA:  Flu like symptoms, cough, body aches, headache for 1 week EXAM: CHEST  2 VIEW COMPARISON:  12/16/2013 FINDINGS: The heart size and mediastinal contours are within normal limits. Both lungs are clear. The visualized skeletal structures are unremarkable. IMPRESSION: No active cardiopulmonary disease. Electronically Signed   By: Elige Ko   On: 07/13/2017 17:09    Procedures Procedures (including critical care  time)  Medications Ordered in ED Medications - No data to display   Initial Impression / Assessment and Plan / ED Course  I have reviewed the triage vital signs and the nursing notes.  Pertinent labs & imaging results that were available during my care of the patient were reviewed by me and considered in my medical decision making (see chart for details).  Pt CXR negative for acute infiltrate. Patients symptoms are consistent with URI, likely viral etiology. Discussed that antibiotics are not indicated for viral infections. Pt will be discharged with symptomatic treatment.  Verbalizes understanding and is agreeable with plan. Pt is hemodynamically stable & in NAD prior to dc.   Final Clinical Impressions(s) / ED Diagnoses   Final diagnoses:  Viral upper respiratory tract infection    New Prescriptions New Prescriptions   BENZONATATE (  TESSALON) 100 MG CAPSULE    Take 1 capsule (100 mg total) by mouth every 8 (eight) hours.   FLUTICASONE (FLONASE) 50 MCG/ACT NASAL SPRAY    Place 2 sprays into both nostrils daily.     Dartha Lodge, PA-C 07/13/17 1953    Dartha Lodge, PA-C 07/13/17 1954    Marily Memos, MD 07/13/17 [id number]

## 2017-07-13 NOTE — ED Notes (Signed)
This Clinical research associate asked pt if she received the flu shot this year, pt stated "no I had the flu this past February", this writer asked pt if she got the flu shot last year and she stated "no, I don't believe in vaccinations or flu shots or nothin' like that".

## 2017-10-18 ENCOUNTER — Encounter (HOSPITAL_COMMUNITY): Payer: Self-pay | Admitting: Emergency Medicine

## 2017-10-18 ENCOUNTER — Other Ambulatory Visit: Payer: Self-pay

## 2017-10-18 ENCOUNTER — Emergency Department (HOSPITAL_COMMUNITY): Payer: Self-pay

## 2017-10-18 DIAGNOSIS — R109 Unspecified abdominal pain: Secondary | ICD-10-CM | POA: Insufficient documentation

## 2017-10-18 DIAGNOSIS — Z5321 Procedure and treatment not carried out due to patient leaving prior to being seen by health care provider: Secondary | ICD-10-CM | POA: Insufficient documentation

## 2017-10-18 LAB — BASIC METABOLIC PANEL
ANION GAP: 7 (ref 5–15)
BUN: 6 mg/dL (ref 6–20)
CALCIUM: 8.6 mg/dL — AB (ref 8.9–10.3)
CHLORIDE: 107 mmol/L (ref 101–111)
CO2: 22 mmol/L (ref 22–32)
Creatinine, Ser: 0.59 mg/dL (ref 0.44–1.00)
GFR calc non Af Amer: >60 mL/min (ref >60)
Glucose, Bld: 93 mg/dL (ref 65–99)
POTASSIUM: 4.2 mmol/L (ref 3.5–5.1)
Sodium: 136 mmol/L (ref 135–145)

## 2017-10-18 LAB — URINALYSIS, ROUTINE W REFLEX MICROSCOPIC
Bilirubin Urine: NEGATIVE
GLUCOSE, UA: NEGATIVE mg/dL
Hgb urine dipstick: NEGATIVE
KETONES UR: NEGATIVE mg/dL
Nitrite: NEGATIVE
PH: 6 (ref 5.0–8.0)
Protein, ur: NEGATIVE mg/dL
Specific Gravity, Urine: 1.02 (ref 1.005–1.030)

## 2017-10-18 LAB — CBC
HEMATOCRIT: 37.1 % (ref 36.0–46.0)
HEMOGLOBIN: 11.7 g/dL — AB (ref 12.0–15.0)
MCH: 22.9 pg — AB (ref 26.0–34.0)
MCHC: 31.5 g/dL (ref 30.0–36.0)
MCV: 72.7 fL — ABNORMAL LOW (ref 78.0–100.0)
Platelets: 247 10*3/uL (ref 150–400)
RBC: 5.1 MIL/uL (ref 3.87–5.11)
RDW: 13.8 % (ref 11.5–15.5)
WBC: 5.9 10*3/uL (ref 4.0–10.5)

## 2017-10-18 LAB — I-STAT TROPONIN, ED: TROPONIN I, POC: 0 ng/mL (ref 0.00–0.08)

## 2017-10-18 LAB — I-STAT BETA HCG BLOOD, ED (MC, WL, AP ONLY): I-stat hCG, quantitative: <5 m[IU]/mL (ref <5)

## 2017-10-18 NOTE — ED Triage Notes (Signed)
Pt reports R sided pain onset last night, states pain starts in her head and radiates down entire R side into leg. Pt also reports she had episode of CP on Monday.

## 2017-10-19 ENCOUNTER — Emergency Department (HOSPITAL_COMMUNITY)
Admission: EM | Admit: 2017-10-19 | Discharge: 2017-10-19 | Disposition: A | Discharge status: Home / Self Care | Payer: Self-pay | Attending: Emergency Medicine | Admitting: Emergency Medicine

## 2017-10-19 NOTE — ED Notes (Signed)
Pts name called for a room no answer 

## 2017-11-25 IMAGING — DX DG LUMBAR SPINE COMPLETE 4+V
5 series · 5 of 5 positions shown · non-contrast
Comparison: CT of the abdomen and pelvis 07/14/2015.

CLINICAL DATA: Low back pain extending into both legs, worse on the
right. Symptoms for 2 months. Symptoms worse after chiropractic
adjustment.

EXAM:
LUMBAR SPINE - COMPLETE 4+ VIEW

[l-spine ap]
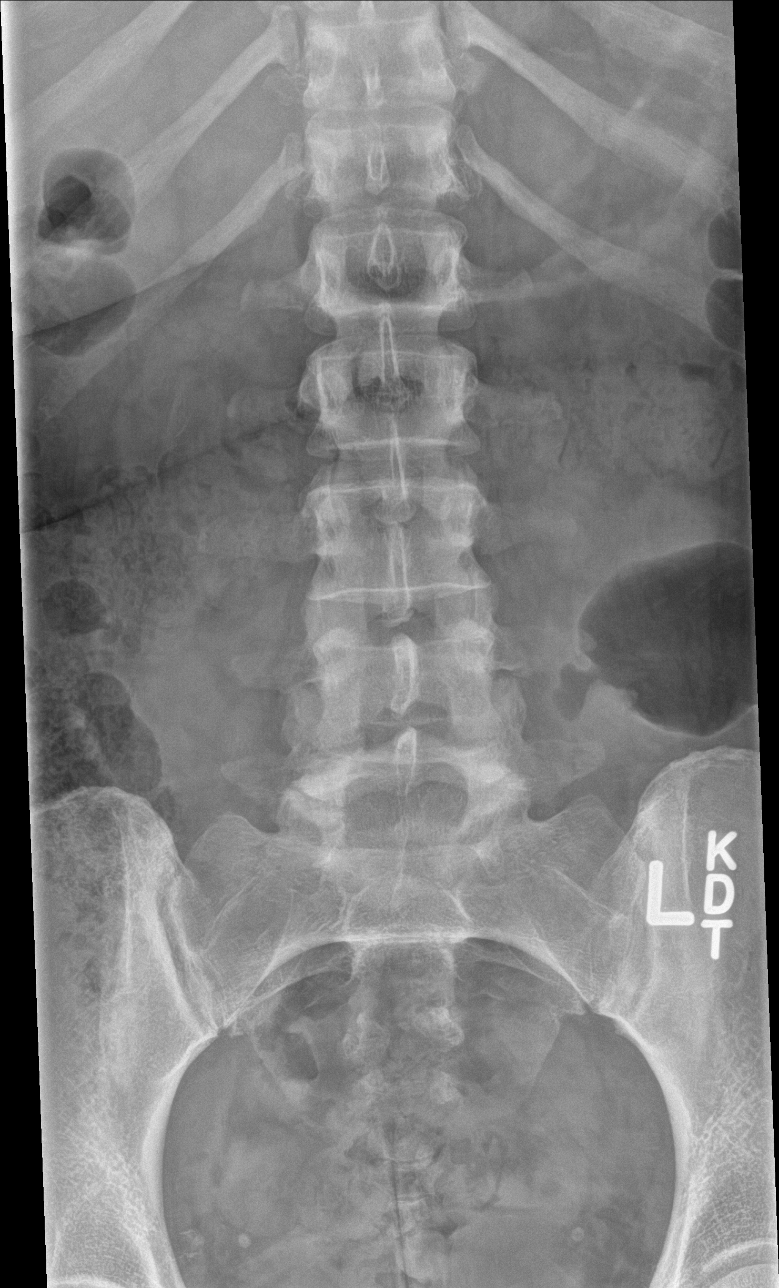

[l-spine obl (1 of 2)]
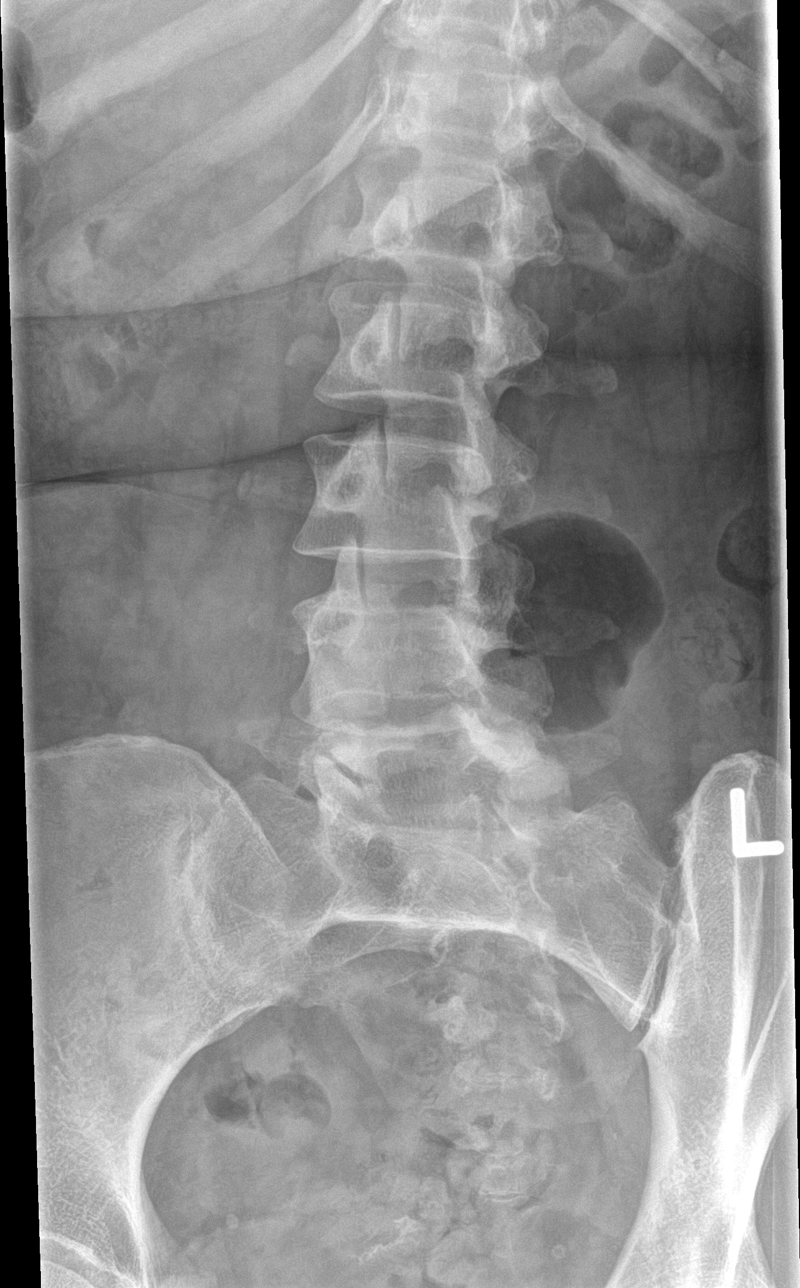

[l-spine obl (2 of 2)]
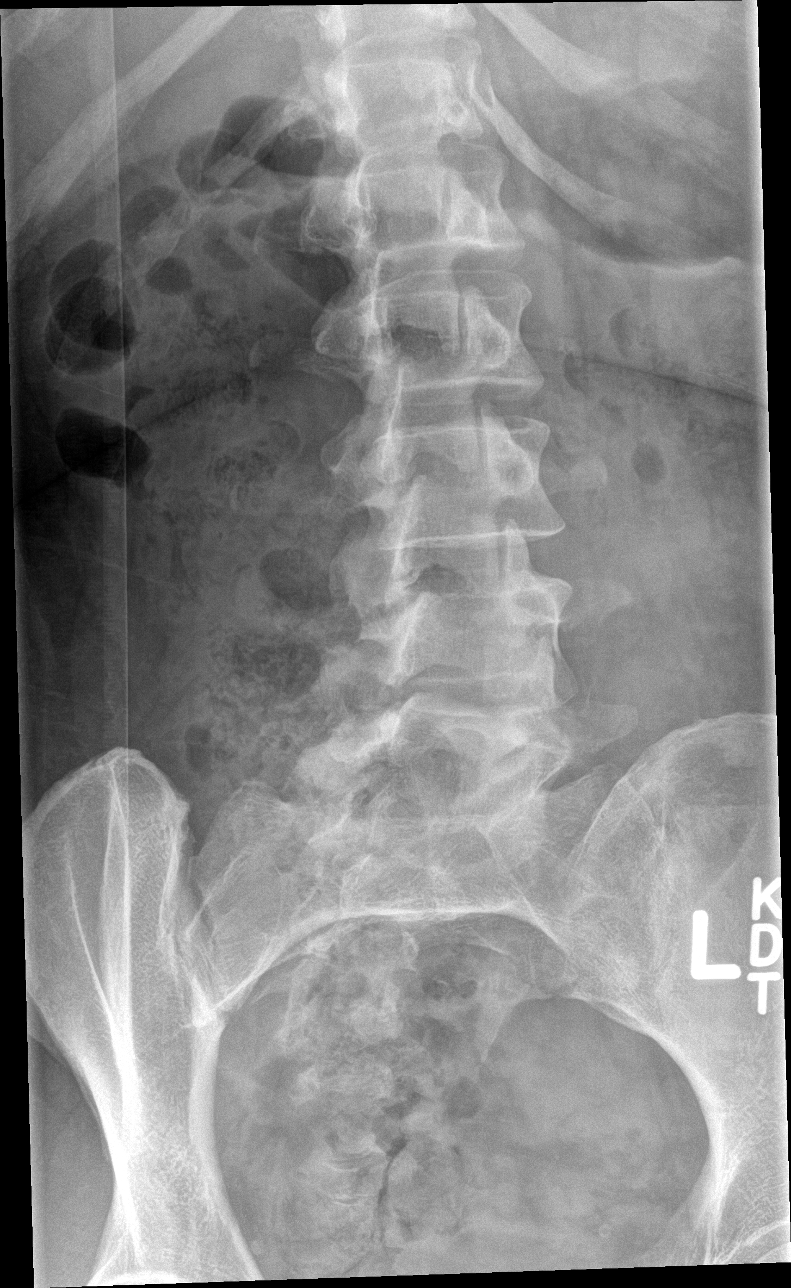

[l-spine lat]
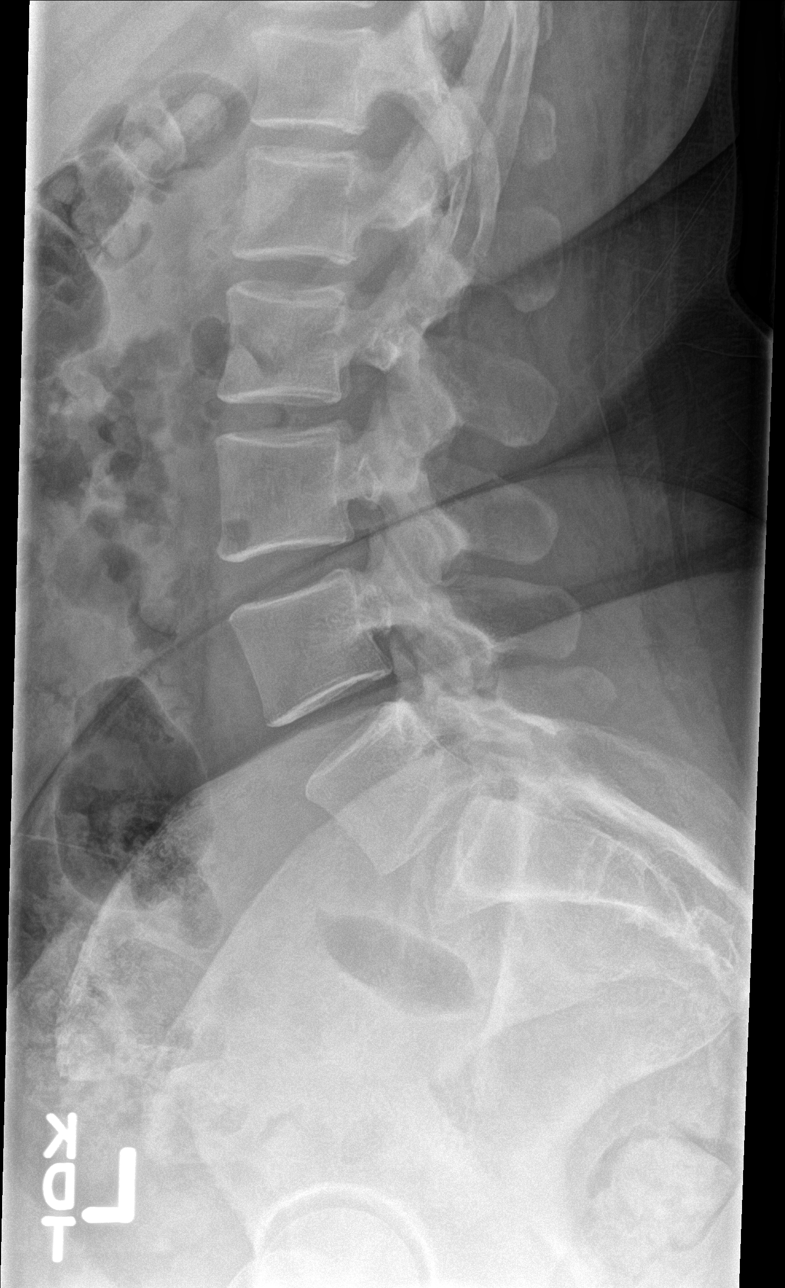

[l-spine spot]
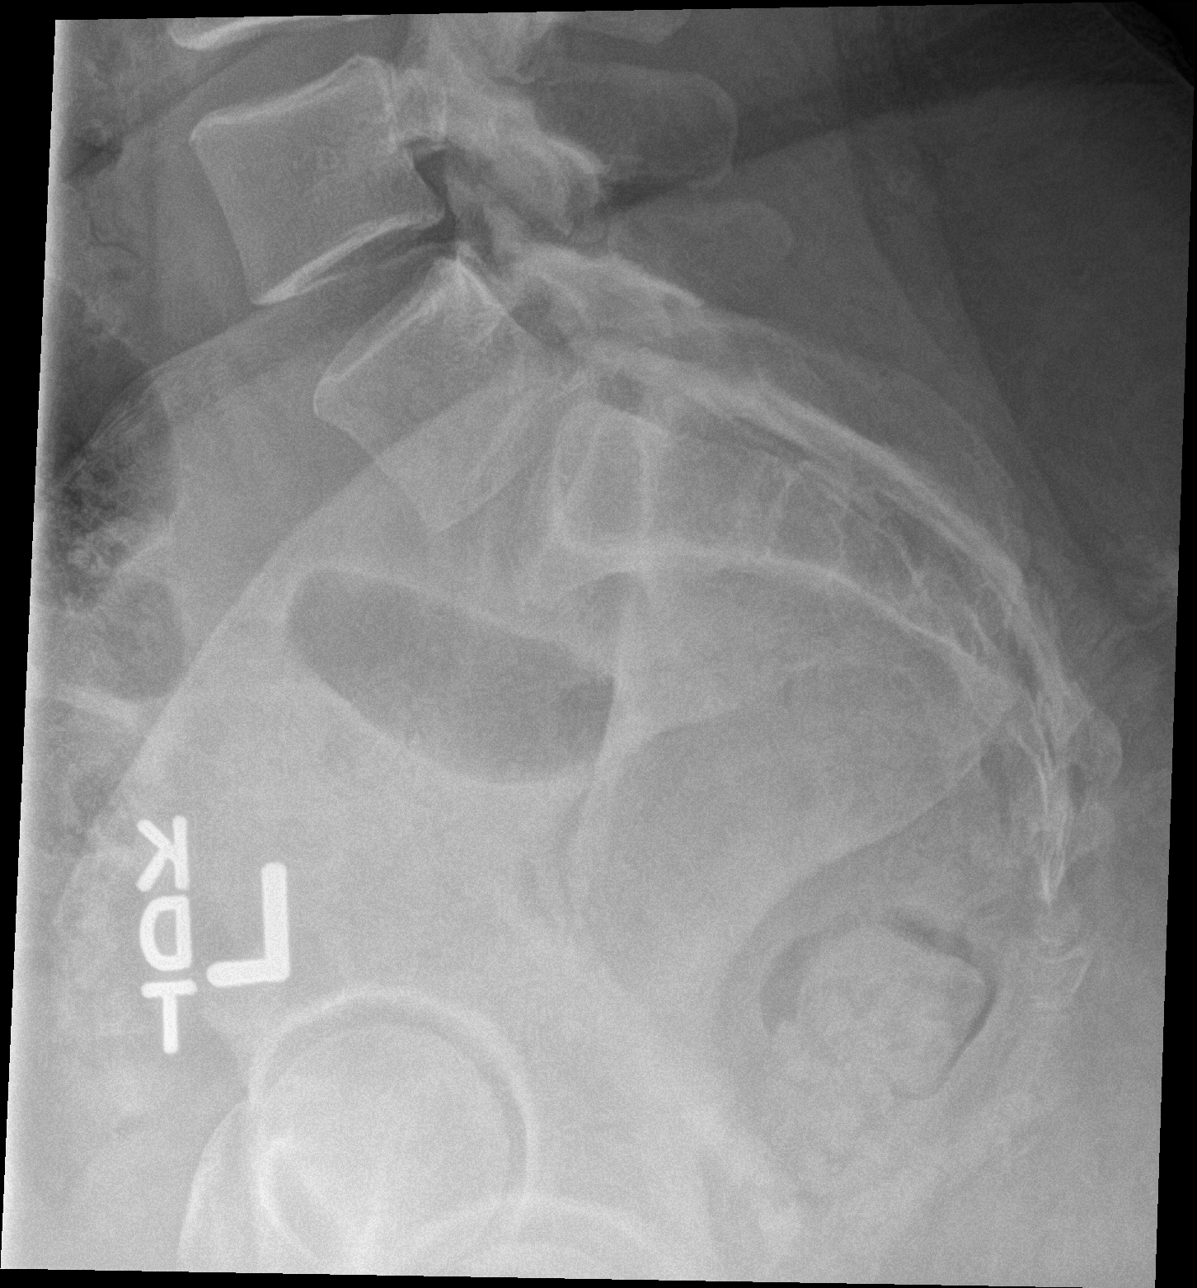

[5 of 5 positions shown; findings below may reference images not displayed]

FINDINGS: Five non rib-bearing lumbar type vertebral bodies are present.
Vertebral body heights and alignment are maintained. Facet
degenerative changes are again noted at L5-S1, right greater than
left. Disc heights are preserved.
IMPRESSION: 1. Mild facet degenerative changes are most evident at L5-S1.
2. Otherwise negative lumbar spine radiographs.

## 2018-01-13 DIAGNOSIS — N760 Acute vaginitis: Secondary | ICD-10-CM | POA: Diagnosis not present

## 2018-01-13 DIAGNOSIS — Z309 Encounter for contraceptive management, unspecified: Secondary | ICD-10-CM | POA: Diagnosis not present

## 2018-01-13 DIAGNOSIS — N9489 Other specified conditions associated with female genital organs and menstrual cycle: Secondary | ICD-10-CM | POA: Diagnosis not present

## 2018-01-13 DIAGNOSIS — Z124 Encounter for screening for malignant neoplasm of cervix: Secondary | ICD-10-CM | POA: Diagnosis not present

## 2018-02-24 DIAGNOSIS — K219 Gastro-esophageal reflux disease without esophagitis: Secondary | ICD-10-CM | POA: Diagnosis not present

## 2018-02-24 DIAGNOSIS — J302 Other seasonal allergic rhinitis: Secondary | ICD-10-CM | POA: Diagnosis not present

## 2018-02-24 DIAGNOSIS — M609 Myositis, unspecified: Secondary | ICD-10-CM | POA: Diagnosis not present

## 2018-02-24 DIAGNOSIS — N9489 Other specified conditions associated with female genital organs and menstrual cycle: Secondary | ICD-10-CM | POA: Diagnosis not present

## 2018-02-26 DIAGNOSIS — N858 Other specified noninflammatory disorders of uterus: Secondary | ICD-10-CM | POA: Diagnosis not present

## 2018-06-12 DIAGNOSIS — N9489 Other specified conditions associated with female genital organs and menstrual cycle: Secondary | ICD-10-CM | POA: Diagnosis not present

## 2018-06-12 DIAGNOSIS — M609 Myositis, unspecified: Secondary | ICD-10-CM | POA: Diagnosis not present

## 2018-06-12 DIAGNOSIS — K219 Gastro-esophageal reflux disease without esophagitis: Secondary | ICD-10-CM | POA: Diagnosis not present

## 2018-06-12 DIAGNOSIS — J302 Other seasonal allergic rhinitis: Secondary | ICD-10-CM | POA: Diagnosis not present

## 2018-06-12 DIAGNOSIS — Z309 Encounter for contraceptive management, unspecified: Secondary | ICD-10-CM | POA: Diagnosis not present

## 2018-07-25 DIAGNOSIS — Z13220 Encounter for screening for lipoid disorders: Secondary | ICD-10-CM | POA: Diagnosis not present

## 2018-07-25 DIAGNOSIS — N9489 Other specified conditions associated with female genital organs and menstrual cycle: Secondary | ICD-10-CM | POA: Diagnosis not present

## 2018-07-25 DIAGNOSIS — F419 Anxiety disorder, unspecified: Secondary | ICD-10-CM | POA: Diagnosis not present

## 2018-07-25 DIAGNOSIS — Z0000 Encounter for general adult medical examination without abnormal findings: Secondary | ICD-10-CM | POA: Diagnosis not present

## 2018-07-25 DIAGNOSIS — Z131 Encounter for screening for diabetes mellitus: Secondary | ICD-10-CM | POA: Diagnosis not present

## 2018-07-25 DIAGNOSIS — M609 Myositis, unspecified: Secondary | ICD-10-CM | POA: Diagnosis not present

## 2018-08-01 DIAGNOSIS — N941 Unspecified dyspareunia: Secondary | ICD-10-CM | POA: Diagnosis not present

## 2018-08-01 DIAGNOSIS — R102 Pelvic and perineal pain: Secondary | ICD-10-CM | POA: Diagnosis not present

## 2018-08-01 DIAGNOSIS — N926 Irregular menstruation, unspecified: Secondary | ICD-10-CM | POA: Diagnosis not present

## 2018-08-01 DIAGNOSIS — N930 Postcoital and contact bleeding: Secondary | ICD-10-CM | POA: Diagnosis not present

## 2018-08-25 DIAGNOSIS — N941 Unspecified dyspareunia: Secondary | ICD-10-CM | POA: Diagnosis not present

## 2018-08-25 DIAGNOSIS — R102 Pelvic and perineal pain: Secondary | ICD-10-CM | POA: Diagnosis not present

## 2018-08-27 ENCOUNTER — Other Ambulatory Visit: Payer: Self-pay | Admitting: Obstetrics and Gynecology

## 2018-08-27 DIAGNOSIS — R102 Pelvic and perineal pain: Secondary | ICD-10-CM

## 2018-09-02 ENCOUNTER — Other Ambulatory Visit: Payer: Self-pay

## 2018-12-19 DIAGNOSIS — K219 Gastro-esophageal reflux disease without esophagitis: Secondary | ICD-10-CM | POA: Diagnosis not present

## 2018-12-19 DIAGNOSIS — M609 Myositis, unspecified: Secondary | ICD-10-CM | POA: Diagnosis not present

## 2018-12-19 DIAGNOSIS — J101 Influenza due to other identified influenza virus with other respiratory manifestations: Secondary | ICD-10-CM | POA: Diagnosis not present

## 2018-12-19 DIAGNOSIS — K1370 Unspecified lesions of oral mucosa: Secondary | ICD-10-CM | POA: Diagnosis not present

## 2018-12-29 DIAGNOSIS — J302 Other seasonal allergic rhinitis: Secondary | ICD-10-CM | POA: Diagnosis not present

## 2018-12-29 DIAGNOSIS — M609 Myositis, unspecified: Secondary | ICD-10-CM | POA: Diagnosis not present

## 2018-12-29 DIAGNOSIS — F321 Major depressive disorder, single episode, moderate: Secondary | ICD-10-CM | POA: Diagnosis not present

## 2018-12-29 DIAGNOSIS — K219 Gastro-esophageal reflux disease without esophagitis: Secondary | ICD-10-CM | POA: Diagnosis not present

## 2019-01-07 DIAGNOSIS — J302 Other seasonal allergic rhinitis: Secondary | ICD-10-CM | POA: Diagnosis not present

## 2019-01-07 DIAGNOSIS — J029 Acute pharyngitis, unspecified: Secondary | ICD-10-CM | POA: Diagnosis not present

## 2019-02-02 DIAGNOSIS — K219 Gastro-esophageal reflux disease without esophagitis: Secondary | ICD-10-CM | POA: Diagnosis not present

## 2019-02-02 DIAGNOSIS — M609 Myositis, unspecified: Secondary | ICD-10-CM | POA: Diagnosis not present

## 2019-02-02 DIAGNOSIS — F321 Major depressive disorder, single episode, moderate: Secondary | ICD-10-CM | POA: Diagnosis not present

## 2019-02-02 DIAGNOSIS — J302 Other seasonal allergic rhinitis: Secondary | ICD-10-CM | POA: Diagnosis not present

## 2019-02-20 DIAGNOSIS — M609 Myositis, unspecified: Secondary | ICD-10-CM | POA: Diagnosis not present

## 2019-02-20 DIAGNOSIS — F419 Anxiety disorder, unspecified: Secondary | ICD-10-CM | POA: Diagnosis not present

## 2019-02-20 DIAGNOSIS — R070 Pain in throat: Secondary | ICD-10-CM | POA: Diagnosis not present

## 2019-02-20 DIAGNOSIS — K219 Gastro-esophageal reflux disease without esophagitis: Secondary | ICD-10-CM | POA: Diagnosis not present

## 2019-03-03 DIAGNOSIS — K449 Diaphragmatic hernia without obstruction or gangrene: Secondary | ICD-10-CM | POA: Diagnosis not present

## 2019-03-03 DIAGNOSIS — K219 Gastro-esophageal reflux disease without esophagitis: Secondary | ICD-10-CM | POA: Diagnosis not present

## 2019-03-03 DIAGNOSIS — Z7289 Other problems related to lifestyle: Secondary | ICD-10-CM | POA: Diagnosis not present

## 2019-03-06 DIAGNOSIS — K219 Gastro-esophageal reflux disease without esophagitis: Secondary | ICD-10-CM | POA: Diagnosis not present

## 2019-03-06 DIAGNOSIS — J302 Other seasonal allergic rhinitis: Secondary | ICD-10-CM | POA: Diagnosis not present

## 2019-03-06 DIAGNOSIS — M609 Myositis, unspecified: Secondary | ICD-10-CM | POA: Diagnosis not present

## 2019-03-06 DIAGNOSIS — F419 Anxiety disorder, unspecified: Secondary | ICD-10-CM | POA: Diagnosis not present

## 2019-04-06 DIAGNOSIS — J302 Other seasonal allergic rhinitis: Secondary | ICD-10-CM | POA: Diagnosis not present

## 2019-04-06 DIAGNOSIS — F419 Anxiety disorder, unspecified: Secondary | ICD-10-CM | POA: Diagnosis not present

## 2019-04-06 DIAGNOSIS — K219 Gastro-esophageal reflux disease without esophagitis: Secondary | ICD-10-CM | POA: Diagnosis not present

## 2019-04-06 DIAGNOSIS — Z309 Encounter for contraceptive management, unspecified: Secondary | ICD-10-CM | POA: Diagnosis not present

## 2019-04-28 DIAGNOSIS — Z309 Encounter for contraceptive management, unspecified: Secondary | ICD-10-CM | POA: Diagnosis not present

## 2019-04-28 DIAGNOSIS — J302 Other seasonal allergic rhinitis: Secondary | ICD-10-CM | POA: Diagnosis not present

## 2019-04-28 DIAGNOSIS — K219 Gastro-esophageal reflux disease without esophagitis: Secondary | ICD-10-CM | POA: Diagnosis not present

## 2019-04-28 DIAGNOSIS — E669 Obesity, unspecified: Secondary | ICD-10-CM | POA: Diagnosis not present

## 2019-04-28 DIAGNOSIS — F419 Anxiety disorder, unspecified: Secondary | ICD-10-CM | POA: Diagnosis not present

## 2019-08-20 DIAGNOSIS — F419 Anxiety disorder, unspecified: Secondary | ICD-10-CM | POA: Diagnosis not present

## 2019-08-20 DIAGNOSIS — Z0000 Encounter for general adult medical examination without abnormal findings: Secondary | ICD-10-CM | POA: Diagnosis not present

## 2019-08-20 DIAGNOSIS — Z309 Encounter for contraceptive management, unspecified: Secondary | ICD-10-CM | POA: Diagnosis not present

## 2019-08-20 DIAGNOSIS — K219 Gastro-esophageal reflux disease without esophagitis: Secondary | ICD-10-CM | POA: Diagnosis not present

## 2019-08-20 DIAGNOSIS — N760 Acute vaginitis: Secondary | ICD-10-CM | POA: Diagnosis not present

## 2019-10-30 DIAGNOSIS — H9201 Otalgia, right ear: Secondary | ICD-10-CM | POA: Diagnosis not present

## 2019-10-30 DIAGNOSIS — J029 Acute pharyngitis, unspecified: Secondary | ICD-10-CM | POA: Diagnosis not present

## 2019-11-17 DIAGNOSIS — Z124 Encounter for screening for malignant neoplasm of cervix: Secondary | ICD-10-CM | POA: Diagnosis not present

## 2019-11-17 DIAGNOSIS — G4730 Sleep apnea, unspecified: Secondary | ICD-10-CM | POA: Diagnosis not present

## 2019-11-17 DIAGNOSIS — Z0000 Encounter for general adult medical examination without abnormal findings: Secondary | ICD-10-CM | POA: Diagnosis not present

## 2019-11-17 DIAGNOSIS — R5383 Other fatigue: Secondary | ICD-10-CM | POA: Diagnosis not present

## 2019-11-17 DIAGNOSIS — Z01419 Encounter for gynecological examination (general) (routine) without abnormal findings: Secondary | ICD-10-CM | POA: Diagnosis not present

## 2019-11-17 DIAGNOSIS — N760 Acute vaginitis: Secondary | ICD-10-CM | POA: Diagnosis not present

## 2019-11-17 DIAGNOSIS — M26601 Right temporomandibular joint disorder, unspecified: Secondary | ICD-10-CM | POA: Diagnosis not present

## 2019-11-17 DIAGNOSIS — E559 Vitamin D deficiency, unspecified: Secondary | ICD-10-CM | POA: Diagnosis not present

## 2019-11-17 DIAGNOSIS — Z131 Encounter for screening for diabetes mellitus: Secondary | ICD-10-CM | POA: Diagnosis not present

## 2019-11-17 DIAGNOSIS — Z13220 Encounter for screening for lipoid disorders: Secondary | ICD-10-CM | POA: Diagnosis not present

## 2019-11-18 ENCOUNTER — Other Ambulatory Visit: Payer: Self-pay | Admitting: Internal Medicine

## 2019-11-18 DIAGNOSIS — Z1231 Encounter for screening mammogram for malignant neoplasm of breast: Secondary | ICD-10-CM

## 2019-12-15 DIAGNOSIS — F419 Anxiety disorder, unspecified: Secondary | ICD-10-CM | POA: Diagnosis not present

## 2019-12-15 DIAGNOSIS — K219 Gastro-esophageal reflux disease without esophagitis: Secondary | ICD-10-CM | POA: Diagnosis not present

## 2019-12-15 DIAGNOSIS — G4730 Sleep apnea, unspecified: Secondary | ICD-10-CM | POA: Diagnosis not present

## 2019-12-15 DIAGNOSIS — J302 Other seasonal allergic rhinitis: Secondary | ICD-10-CM | POA: Diagnosis not present

## 2020-01-26 DIAGNOSIS — M609 Myositis, unspecified: Secondary | ICD-10-CM | POA: Diagnosis not present

## 2020-01-26 DIAGNOSIS — K219 Gastro-esophageal reflux disease without esophagitis: Secondary | ICD-10-CM | POA: Diagnosis not present

## 2020-01-26 DIAGNOSIS — G4730 Sleep apnea, unspecified: Secondary | ICD-10-CM | POA: Diagnosis not present

## 2020-01-26 DIAGNOSIS — F419 Anxiety disorder, unspecified: Secondary | ICD-10-CM | POA: Diagnosis not present

## 2020-03-15 DIAGNOSIS — F419 Anxiety disorder, unspecified: Secondary | ICD-10-CM | POA: Diagnosis not present

## 2020-03-15 DIAGNOSIS — M609 Myositis, unspecified: Secondary | ICD-10-CM | POA: Diagnosis not present

## 2020-03-15 DIAGNOSIS — K219 Gastro-esophageal reflux disease without esophagitis: Secondary | ICD-10-CM | POA: Diagnosis not present

## 2020-09-16 DIAGNOSIS — K219 Gastro-esophageal reflux disease without esophagitis: Secondary | ICD-10-CM | POA: Diagnosis not present

## 2020-09-16 DIAGNOSIS — F419 Anxiety disorder, unspecified: Secondary | ICD-10-CM | POA: Diagnosis not present

## 2020-09-16 DIAGNOSIS — J302 Other seasonal allergic rhinitis: Secondary | ICD-10-CM | POA: Diagnosis not present

## 2020-09-16 DIAGNOSIS — M609 Myositis, unspecified: Secondary | ICD-10-CM | POA: Diagnosis not present

## 2020-12-15 DIAGNOSIS — E559 Vitamin D deficiency, unspecified: Secondary | ICD-10-CM | POA: Diagnosis not present

## 2020-12-15 DIAGNOSIS — M609 Myositis, unspecified: Secondary | ICD-10-CM | POA: Diagnosis not present

## 2020-12-15 DIAGNOSIS — Z0000 Encounter for general adult medical examination without abnormal findings: Secondary | ICD-10-CM | POA: Diagnosis not present

## 2020-12-15 DIAGNOSIS — Z13220 Encounter for screening for lipoid disorders: Secondary | ICD-10-CM | POA: Diagnosis not present

## 2020-12-15 DIAGNOSIS — F419 Anxiety disorder, unspecified: Secondary | ICD-10-CM | POA: Diagnosis not present

## 2020-12-15 DIAGNOSIS — Z131 Encounter for screening for diabetes mellitus: Secondary | ICD-10-CM | POA: Diagnosis not present

## 2021-01-05 DIAGNOSIS — K219 Gastro-esophageal reflux disease without esophagitis: Secondary | ICD-10-CM | POA: Diagnosis not present

## 2021-01-05 DIAGNOSIS — F321 Major depressive disorder, single episode, moderate: Secondary | ICD-10-CM | POA: Diagnosis not present

## 2021-01-05 DIAGNOSIS — F419 Anxiety disorder, unspecified: Secondary | ICD-10-CM | POA: Diagnosis not present

## 2021-01-05 DIAGNOSIS — M609 Myositis, unspecified: Secondary | ICD-10-CM | POA: Diagnosis not present

## 2021-02-23 ENCOUNTER — Encounter (HOSPITAL_COMMUNITY): Payer: Self-pay

## 2021-02-23 ENCOUNTER — Emergency Department (HOSPITAL_COMMUNITY): Payer: BC Managed Care – PPO

## 2021-02-23 ENCOUNTER — Other Ambulatory Visit: Payer: Self-pay

## 2021-02-23 ENCOUNTER — Emergency Department (HOSPITAL_COMMUNITY)
Admission: EM | Admit: 2021-02-23 | Discharge: 2021-02-23 | Disposition: A | Discharge status: Home / Self Care | Payer: BC Managed Care – PPO | Attending: Emergency Medicine | Admitting: Emergency Medicine

## 2021-02-23 DIAGNOSIS — R072 Precordial pain: Secondary | ICD-10-CM | POA: Insufficient documentation

## 2021-02-23 DIAGNOSIS — R110 Nausea: Secondary | ICD-10-CM | POA: Insufficient documentation

## 2021-02-23 LAB — CBC
HCT: 38 % (ref 36.0–46.0)
Hemoglobin: 11.7 g/dL — ABNORMAL LOW (ref 12.0–15.0)
MCH: 22.6 pg — ABNORMAL LOW (ref 26.0–34.0)
MCHC: 30.8 g/dL (ref 30.0–36.0)
MCV: 73.5 fL — ABNORMAL LOW (ref 80.0–100.0)
Platelets: 193 10*3/uL (ref 150–400)
RBC: 5.17 MIL/uL — ABNORMAL HIGH (ref 3.87–5.11)
RDW: 14.3 % (ref 11.5–15.5)
WBC: 5.7 10*3/uL (ref 4.0–10.5)
nRBC: 0 % (ref 0.0–0.2)

## 2021-02-23 LAB — BASIC METABOLIC PANEL
Anion gap: 7 (ref 5–15)
BUN: 9 mg/dL (ref 6–20)
CO2: 21 mmol/L — ABNORMAL LOW (ref 22–32)
Calcium: 8.5 mg/dL — ABNORMAL LOW (ref 8.9–10.3)
Chloride: 108 mmol/L (ref 98–111)
Creatinine, Ser: 0.83 mg/dL (ref 0.44–1.00)
GFR, Estimated: >60 mL/min (ref >60)
Glucose, Bld: 123 mg/dL — ABNORMAL HIGH (ref 70–99)
Potassium: 3.8 mmol/L (ref 3.5–5.1)
Sodium: 136 mmol/L (ref 135–145)

## 2021-02-23 LAB — TROPONIN I (HIGH SENSITIVITY)
Troponin I (High Sensitivity): <2 ng/L (ref <18)
Troponin I (High Sensitivity): <2 ng/L (ref <18)

## 2021-02-23 LAB — I-STAT BETA HCG BLOOD, ED (MC, WL, AP ONLY): I-stat hCG, quantitative: <5 m[IU]/mL (ref <5)

## 2021-02-23 MED ORDER — ACETAMINOPHEN 500 MG PO TABS
1000.0000 mg | ORAL_TABLET | Freq: Once | ORAL | Status: AC
  Administered 2021-02-23: 1000 mg via ORAL
  Filled 2021-02-23: qty 2

## 2021-02-23 MED ORDER — ALUM & MAG HYDROXIDE-SIMETH 200-200-20 MG/5ML PO SUSP
30.0000 mL | Freq: Once | ORAL | Status: AC
  Administered 2021-02-23: 30 mL via ORAL
  Filled 2021-02-23: qty 30

## 2021-02-23 MED ORDER — OMEPRAZOLE 20 MG PO CPDR: 20.0000 mg | DELAYED_RELEASE_CAPSULE | Freq: Every day | ORAL | 0 refills | Status: DC

## 2021-02-23 MED ORDER — ONDANSETRON 4 MG PO TBDP
8.0000 mg | ORAL_TABLET | Freq: Once | ORAL | Status: AC
  Administered 2021-02-23: 8 mg via ORAL
  Filled 2021-02-23: qty 2

## 2021-02-23 NOTE — ED Provider Notes (Addendum)
[facility] EMERGENCY DEPARTMENT Provider Note   CSN: [id number] Arrival date & time: 02/23/21  0103     History Chief Complaint  Patient presents with  . Chest Pain  . Nausea    Meredith Reeves is a 40 y.o. female.  The history is provided by the patient.  Chest Pain Pain location:  Substernal area Pain quality comment:  "prinkly" Pain radiates to:  Does not radiate Pain severity:  Moderate Onset quality:  Gradual Timing:  Intermittent Progression:  Waxing and waning Chronicity:  New Context: at rest   Relieved by:  Nothing Worsened by:  Nothing Ineffective treatments:  None tried Associated symptoms: no abdominal pain, no AICD problem, no altered mental status, no anorexia, no anxiety, no back pain, no claudication, no cough, no diaphoresis, no dizziness, no dysphagia, no fatigue, no fever, no headache, no heartburn, no lower extremity edema, no near-syncope, no numbness, no orthopnea, no palpitations, no shortness of breath, no syncope, no vomiting and no weakness   Risk factors: no aortic disease, no birth control, no coronary artery disease, not pregnant and no prior DVT/PE   No leg pain or travel, no OCP.  No exertional symptoms.       Past Medical History:  Diagnosis Date  . Cough   . Fibromyalgia Dx 2012  . GERD (gastroesophageal reflux disease) Dx 2004  . Migraines Dx 2012  . Staph infection   . Thyroid nodule   . Trouble swallowing     Patient Active Problem List   Diagnosis Date Noted  . Scabies 11/04/2015  . 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 abdomnioplasty    . breast reduction  2005  . CESAREAN SECTION  1996  . liposection  2010     OB History    Gravida  1   Para  1   Term      Preterm  1   AB      Living  1     SAB      IAB      Ectopic      Multiple      Live Births  1           Family History  Problem Relation Age of Onset   . Alcohol abuse Father   . Heart disease Father   . Hyperlipidemia Father   . Anesthesia problems Neg Hx     Social History   Tobacco Use  . Smoking status: Never Smoker  . Smokeless tobacco: Never Used  Vaping Use  . Vaping Use: Never used  Substance Use Topics  . Alcohol use: Yes    Comment: socially  . Drug use: No    Home Medications Prior to Admission medications   Medication Sig Start Date End Date Taking? Authorizing Provider  acyclovir (ZOVIRAX) 400 MG tablet Take 2 tablets (800 mg total) by mouth 2 (two) times daily. 05/17/16   Janne Napoleon, NP  benzonatate (TESSALON) 100 MG capsule Take 1 capsule (100 mg total) by mouth every 8 (eight) hours. 07/13/17   Dartha Lodge, PA-C  cyclobenzaprine (FLEXERIL) 10 MG tablet Take 10 mg by mouth 2 (two) times daily as needed for muscle spasms.     [provider]  diazepam (VALIUM) 5 MG tablet Take 1 tablet (5 mg total) by mouth 2 (two) times daily. Patient taking differently: Take 5 mg by mouth daily as needed for  anxiety.  11/24/13   Lurene Shadow, PA-C  fluconazole (DIFLUCAN) 200 MG tablet Take 1 tablet (200 mg total) by mouth daily. 01/19/17   Rolan Bucco, MD  fluticasone (FLONASE) 50 MCG/ACT nasal spray Place 2 sprays into both nostrils daily. 07/13/17   Dartha Lodge, PA-C  hydrOXYzine (ATARAX/VISTARIL) 25 MG tablet Take 1 tablet (25 mg total) by mouth 3 (three) times daily as needed for itching. 11/04/15   Funches, Gerilyn Nestle, MD  medroxyPROGESTERone (DEPO-PROVERA) 150 MG/ML injection Inject 150 mg into the muscle every 3 (three) months.  10/16/14   [provider]  oxyCODONE-acetaminophen (PERCOCET/ROXICET) 5-325 MG tablet Take 1 tablet by mouth every 6 (six) hours as needed for severe pain. 01/19/17   Rolan Bucco, MD  predniSONE (DELTASONE) 50 MG tablet Take 1 tablet (50 mg total) by mouth daily with breakfast. 01/18/17   Lawyer, Cristal Deer, PA-C    Allergies    Ibuprofen and Vicodin  [hydrocodone-acetaminophen]  Review of Systems   Review of Systems  Constitutional: Negative for diaphoresis, fatigue and fever.  HENT: Negative for trouble swallowing.   Eyes: Negative for visual disturbance.  Respiratory: Negative for cough and shortness of breath.   Cardiovascular: Positive for chest pain. Negative for palpitations, orthopnea, claudication, syncope and near-syncope.  Gastrointestinal: Negative for abdominal pain, anorexia, heartburn and vomiting.  Genitourinary: Negative for difficulty urinating.  Musculoskeletal: Negative for back pain.  Skin: Negative for rash.  Neurological: Negative for dizziness, weakness, numbness and headaches.  Psychiatric/Behavioral: Negative for agitation.  All other systems reviewed and are negative.   Physical Exam Updated Vital Signs BP 132/86 (BP Location: Left Arm)   Pulse 79   Temp 99 F (37.2 C) (Oral)   Resp 17   Ht 5\' 2"  (1.575 m)   Wt 83 kg   SpO2 100%   BMI 33.47 kg/m   Physical Exam Vitals and nursing note reviewed.  Constitutional:      Appearance: Normal appearance.  HENT:     Head: Normocephalic and atraumatic.     Nose: Nose normal.  Eyes:     Conjunctiva/sclera: Conjunctivae normal.     Pupils: Pupils are equal, round, and reactive to light.  Cardiovascular:     Rate and Rhythm: Normal rate and regular rhythm.     Pulses: Normal pulses.     Heart sounds: Normal heart sounds.  Pulmonary:     Effort: Pulmonary effort is normal.     Breath sounds: Normal breath sounds.  Abdominal:     General: Abdomen is flat. Bowel sounds are normal.     Palpations: Abdomen is soft.     Tenderness: There is no abdominal tenderness. There is no guarding.  Musculoskeletal:        General: Normal range of motion.     Cervical back: Normal range of motion and neck supple.  Skin:    General: Skin is warm and dry.     Capillary Refill: Capillary refill takes less than 2 seconds.  Neurological:     General: No focal  deficit present.     Mental Status: She is alert and oriented to person, place, and time.     Deep Tendon Reflexes: Reflexes normal.  Psychiatric:        Mood and Affect: Mood normal.        Behavior: Behavior normal.     ED Results / Procedures / Treatments   Labs (all labs ordered are listed, but only abnormal results are displayed) Results for orders placed  or performed during the hospital encounter of 02/23/21  Basic metabolic panel  Result Value Ref Range   Sodium 136 135 - 145 mmol/L   Potassium 3.8 3.5 - 5.1 mmol/L   Chloride 108 98 - 111 mmol/L   CO2 21 (L) 22 - 32 mmol/L   Glucose, Bld 123 (H) 70 - 99 mg/dL   BUN 9 6 - 20 mg/dL   Creatinine, Ser 1.06 0.44 - 1.00 mg/dL   Calcium 8.5 (L) 8.9 - 10.3 mg/dL   GFR, Estimated >26 >94 mL/min   Anion gap 7 5 - 15  CBC  Result Value Ref Range   WBC 5.7 4.0 - 10.5 K/uL   RBC 5.17 (H) 3.87 - 5.11 MIL/uL   Hemoglobin 11.7 (L) 12.0 - 15.0 g/dL   HCT 85.4 62.7 - 03.5 %   MCV 73.5 (L) 80.0 - 100.0 fL   MCH 22.6 (L) 26.0 - 34.0 pg   MCHC 30.8 30.0 - 36.0 g/dL   RDW 00.9 38.1 - 82.9 %   Platelets 193 150 - 400 K/uL   nRBC 0.0 0.0 - 0.2 %  I-Stat beta hCG blood, ED  Result Value Ref Range   I-stat hCG, quantitative <5.0 <5 mIU/mL   Comment 3          Troponin I (High Sensitivity)  Result Value Ref Range   Troponin I (High Sensitivity) <2 <18 ng/L  Troponin I (High Sensitivity)  Result Value Ref Range   Troponin I (High Sensitivity) <2 <18 ng/L   DG Chest 2 View  Result Date: 02/23/2021 CLINICAL DATA:  Chest pain EXAM: CHEST - 2 VIEW COMPARISON:  October 18, 2017. FINDINGS: The heart size and mediastinal contours are within normal limits. No focal consolidation. No pleural effusion. No pneumothorax. The visualized skeletal structures are unremarkable. No significant change in the prominent soft tissue in the superior mediastinum previously evaluated by ultrasound and consistent with multinodular goiter. IMPRESSION: No acute  cardiopulmonary disease. Electronically Signed   By: Maudry Mayhew MD   On: 02/23/2021 01:51    EKG EKG Interpretation  Date/Time:  Thursday Feb 23 2021 01:10:31 EDT Ventricular Rate:  67 PR Interval:  172 QRS Duration: 82 QT Interval:  388 QTC Calculation: 409 R Axis:   63 Text Interpretation: Normal sinus rhythm with sinus arrhythmia Confirmed by Nicanor Alcon, Thelda Gagan ([id number]) on 02/23/2021 3:38:11 AM   Radiology DG Chest 2 View  Result Date: 02/23/2021 CLINICAL DATA:  Chest pain EXAM: CHEST - 2 VIEW COMPARISON:  October 18, 2017. FINDINGS: The heart size and mediastinal contours are within normal limits. No focal consolidation. No pleural effusion. No pneumothorax. The visualized skeletal structures are unremarkable. No significant change in the prominent soft tissue in the superior mediastinum previously evaluated by ultrasound and consistent with multinodular goiter. IMPRESSION: No acute cardiopulmonary disease. Electronically Signed   By: Maudry Mayhew MD   On: 02/23/2021 01:51    Procedures Procedures   Medications Ordered in ED Medications  alum & mag hydroxide-simeth (MAALOX/MYLANTA) 200-200-20 MG/5ML suspension 30 mL (30 mLs Oral Given 02/23/21 0349)  ondansetron (ZOFRAN-ODT) disintegrating tablet 8 mg (8 mg Oral Given 02/23/21 0350)  acetaminophen (TYLENOL) tablet 1,000 mg (1,000 mg Oral Given 02/23/21 0350)    ED Course  I have reviewed the triage vital signs and the nursing notes.  Pertinent labs & imaging results that were available during my care of the patient were reviewed by me and considered in my medical decision making (see chart for details).  Ruled out for MI in the ED.  HEART score is 1 very low risk for MACE.  PERC negative wells 0 highly doubt PE in this low risk patient.  Symptoms most consistent with GERD, will start PPI.     Patient expressed gratitude for the care she had received in the ED with tech present.    Jason NestGiovani A Henner was evaluated in  Emergency Department on 02/23/2021 for the symptoms described in the history of present illness. She was evaluated in the context of the global COVID-19 pandemic, which necessitated consideration that the patient might be at risk for infection with the SARS-CoV-2 virus that causes COVID-19. Institutional protocols and algorithms that pertain to the evaluation of patients at risk for COVID-19 are in a state of rapid change based on information released by regulatory bodies including the CDC and federal and state organizations. These policies and algorithms were followed during the patient's care in the ED.  Final Clinical Impression(s) / ED Diagnoses Return for intractable cough, coughing up blood, fevers >100.4 unrelieved by medication, shortness of breath, intractable vomiting, chest pain, shortness of breath, weakness, numbness, changes in speech, facial asymmetry, abdominal pain, passing out, Inability to tolerate liquids or food, cough, altered mental status or any concerns. No signs of systemic illness or infection. The patient is nontoxic-appearing on exam and vital signs are within normal limits.  I have reviewed the triage vital signs and the nursing notes. Pertinent labs & imaging results that were available during my care of the patient were reviewed by me and considered in my medical decision making (see chart for details). After history, exam, and medical workup I feel the patient has been appropriately medically screened and is safe for discharge home. Pertinent diagnoses were discussed with the patient. Patient was given return precautions.    Jesslynn Kruck, MD 02/23/21 [id number]    Cy BlamerPalumbo, Deshayla Empson, MD 02/23/21 [id number]

## 2021-02-23 NOTE — ED Triage Notes (Signed)
Left sided chest pain with nausea started yesterday while at rest.

## 2021-06-26 ENCOUNTER — Other Ambulatory Visit: Payer: Self-pay | Admitting: Internal Medicine

## 2021-06-27 LAB — COMPLETE METABOLIC PANEL WITH GFR
AG Ratio: 1.4 (calc) (ref 1.0–2.5)
ALT: 17 U/L (ref 6–29)
AST: 16 U/L (ref 10–30)
Albumin: 4.4 g/dL (ref 3.6–5.1)
Alkaline phosphatase (APISO): 60 U/L (ref 31–125)
BUN/Creatinine Ratio: 15 (calc) (ref 6–22)
BUN: 15 mg/dL (ref 7–25)
CO2: 23 mmol/L (ref 20–32)
Calcium: 9.1 mg/dL (ref 8.6–10.2)
Chloride: 105 mmol/L (ref 98–110)
Creat: 1 mg/dL — ABNORMAL HIGH (ref 0.50–0.99)
Globulin: 3.2 g/dL (calc) (ref 1.9–3.7)
Glucose, Bld: 64 mg/dL — ABNORMAL LOW (ref 65–99)
Potassium: 3.8 mmol/L (ref 3.5–5.3)
Sodium: 139 mmol/L (ref 135–146)
Total Bilirubin: 0.4 mg/dL (ref 0.2–1.2)
Total Protein: 7.6 g/dL (ref 6.1–8.1)
eGFR: 73 mL/min/{1.73_m2} (ref >=60)

## 2021-06-27 LAB — CBC
HCT: 41.8 % (ref 35.0–45.0)
Hemoglobin: 12.3 g/dL (ref 11.7–15.5)
MCH: 22.1 pg — ABNORMAL LOW (ref 27.0–33.0)
MCHC: 29.4 g/dL — ABNORMAL LOW (ref 32.0–36.0)
MCV: 75 fL — ABNORMAL LOW (ref 80.0–100.0)
MPV: 12.8 fL — ABNORMAL HIGH (ref 7.5–12.5)
Platelets: 256 10*3/uL (ref 140–400)
RBC: 5.57 10*6/uL — ABNORMAL HIGH (ref 3.80–5.10)
RDW: 14.1 % (ref 11.0–15.0)
WBC: 12.5 10*3/uL — ABNORMAL HIGH (ref 3.8–10.8)

## 2021-06-27 LAB — VITAMIN D 25 HYDROXY (VIT D DEFICIENCY, FRACTURES): Vit D, 25-Hydroxy: 30 ng/mL (ref 30–100)

## 2021-07-13 ENCOUNTER — Other Ambulatory Visit: Payer: Self-pay

## 2021-07-13 ENCOUNTER — Ambulatory Visit
Admission: EM | Admit: 2021-07-13 | Discharge: 2021-07-13 | Disposition: A | Discharge status: Home / Self Care | Payer: No Typology Code available for payment source

## 2021-07-13 DIAGNOSIS — M26601 Right temporomandibular joint disorder, unspecified: Secondary | ICD-10-CM

## 2021-07-13 MED ORDER — KETOROLAC TROMETHAMINE 60 MG/2ML IM SOLN
60.0000 mg | Freq: Once | INTRAMUSCULAR | Status: AC
  Administered 2021-07-13: 60 mg via INTRAMUSCULAR

## 2021-07-13 MED ORDER — METHYLPREDNISOLONE 4 MG PO TBPK: ORAL_TABLET | ORAL | 0 refills | Status: DC

## 2021-07-13 MED ORDER — DIAZEPAM 5 MG PO TABS: 5.0000 mg | ORAL_TABLET | Freq: Two times a day (BID) | ORAL | 0 refills | Status: DC

## 2021-07-13 MED ORDER — NAPROXEN 500 MG PO TABS: 500.0000 mg | ORAL_TABLET | Freq: Two times a day (BID) | ORAL | 0 refills | Status: DC

## 2021-07-13 NOTE — ED Provider Notes (Signed)
[facility]    CSN: [id number] Arrival date & time: 07/13/21  4098      History   Chief Complaint Chief Complaint  Patient presents with   Otalgia    HPI Meredith Reeves is a 40 y.o. female.   Patient reports pain in her right ear that radiates down to her jaw and down the right side of her neck, states she has intense pain with swallowing so much so that she is afraid to swallow.  Patient denies difficulty maintaining her secretions.  Patient denies difficulty maintaining her airway.  Patient states she gets this same syndrome approximately once a year, is not aware whether or not it occurs at the same time every year.  Patient states she recently had braces removed and is now wearing a retainer at night.  Patient denies dental caries, cold sensitivity of her teeth, abscess, red or swollen gums.  Patient denies decreased hearing in right ear, drainage from right ear, sense of fullness in right ear.  Patient adds that every time she has her ear evaluated when this occurs, they always tell her other nothing wrong with her ear.  Patient states she has a history of fibromyalgia, states she takes gabapentin and Valium for this.  Patient states she has failed Cymbalta.  Patient states she did take a Valium last night and did have some relief as well.  Patient states naproxen also gives her some pain relief.  Patient also has a history of nontoxic goiter.  The history is provided by the patient.   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 Authorizing Provider  diazepam (VALIUM) 5 MG tablet Take 1 tablet (5 mg total) by mouth 2 (two) times daily. 07/13/21  Yes Theadora Rama Scales, PA-C  methylPREDNISolone (MEDROL DOSEPAK) 4 MG TBPK tablet Take 24 mg on day 1, 20 mg on day 2, 16 mg on day 3, 12 mg on day 4, 8 mg on day 5, 4 mg on day 6. 07/13/21  Yes Theadora Rama Scales, PA-C  naproxen (NAPROSYN) 500 MG tablet Take 1 tablet (500 mg total) by mouth 2 (two) times daily. 07/13/21  Yes Theadora Rama Scales, PA-C  acyclovir (ZOVIRAX) 400 MG tablet Take 2 tablets (800 mg total) by mouth 2 (two) times daily. 05/17/16   Janne Napoleon, NP  benzonatate (TESSALON) 100 MG capsule Take 1 capsule (  100 mg total) by mouth every 8 (eight) hours. 07/13/17   Dartha Lodge, PA-C  cyclobenzaprine (FLEXERIL) 10 MG tablet Take 10 mg by mouth 2 (two) times daily as needed for muscle spasms.     [provider]  diazepam (VALIUM) 5 MG tablet Take 1 tablet (5 mg total) by mouth 2 (two) times daily. Patient taking differently: Take 5 mg by mouth daily as needed for anxiety.  11/24/13   Lurene Shadow, PA-C  fluconazole (DIFLUCAN) 200 MG tablet Take 1 tablet (200 mg total) by mouth daily. 01/19/17   Rolan Bucco, MD  fluticasone (FLONASE) 50 MCG/ACT nasal spray Place 2 sprays into both nostrils daily. 07/13/17   Dartha Lodge, PA-C  gabapentin (NEURONTIN) 600 MG tablet Take 600 mg by mouth 3 (three) times daily. 06/29/21   [provider]  hydrOXYzine (ATARAX/VISTARIL) 25 MG tablet Take 1 tablet (25 mg total) by mouth 3 (three) times daily as needed for itching. 11/04/15   Funches, Gerilyn Nestle, MD  medroxyPROGESTERone (DEPO-PROVERA) 150 MG/ML injection Inject 150 mg into  the muscle every 3 (three) months.  10/16/14   [provider]  omeprazole (PRILOSEC) 20 MG capsule Take 1 capsule (20 mg total) by mouth daily. 02/23/21   Palumbo, April, MD  oxyCODONE-acetaminophen (PERCOCET/ROXICET) 5-325 MG tablet Take 1 tablet by mouth every 6 (six) hours as needed for severe pain. 01/19/17   Rolan Bucco, MD  predniSONE (DELTASONE) 50 MG tablet Take 1 tablet (50 mg total) by mouth daily with breakfast. 01/18/17   Lawyer, Cristal Deer, PA-C  Vitamin D, Ergocalciferol, (DRISDOL) 1.25 MG (50000 UNIT) CAPS capsule Take 50,000 Units by mouth once a week. 06/23/21   [provider]    Family History Family History  Problem Relation Age of Onset   Alcohol abuse Father    Heart disease Father    Hyperlipidemia Father    Anesthesia problems Neg Hx     Social History Social History   Tobacco Use   Smoking status: Never   Smokeless tobacco: Never  Vaping Use   Vaping Use: Never used  Substance Use Topics   Alcohol use: Yes    Comment: socially   Drug use: No     Allergies   Ibuprofen and Vicodin [hydrocodone-acetaminophen]   Review of Systems Review of Systems   Physical Exam Triage Vital Signs ED Triage Vitals  Enc Vitals Group     BP 07/13/21 0857 123/85     Pulse Rate 07/13/21 0857 79     Resp 07/13/21 0857 18     Temp 07/13/21 0857 98.9 F (37.2 C)     Temp Source 07/13/21 0857 Oral     SpO2 07/13/21 0857 98 %     Weight --      Height --      Head Circumference --      Peak Flow --      Pain Score 07/13/21 0852 10     Pain Loc --      Pain Edu? --      Excl. in [place]? --    No data found.  Updated Vital Signs BP 123/85 (BP Location: Right Arm)   Pulse 79   Temp 98.9 F (37.2 C) (Oral)   Resp 18   SpO2 98%   Visual Acuity Right Eye Distance:   Left Eye Distance:   Bilateral Distance:    Right Eye Near:   Left Eye Near:    Bilateral Near:  Physical Exam   UC Treatments / Results  Labs (all labs ordered are  listed, but only abnormal results are displayed) Labs Reviewed - No data to display  EKG   Radiology No results found.  Procedures Procedures (including critical care time)  Medications Ordered in UC Medications  ketorolac (TORADOL) injection 60 mg (60 mg Intramuscular Given 07/13/21 0959)    Initial Impression / Assessment and Plan / UC Course  I have reviewed the triage vital signs and the nursing notes.  Pertinent labs & imaging results that were available during my care of the patient were reviewed by me and considered in my medical decision making (see chart for details).     I recommend that patient begin a course of oral steroids and continue Valium for treatment of presumed temporomandibular joint dysfunction.  I provided her with a short course of Valium in case her current prescription, last filled in February runs out.  I also provided her with a prescription for naproxen to take after the oral steroids are complete should she need continued pain relief.  I advised her to follow-up with her primary care physician and/or her orthodontist to discuss this issue.Patient verbalized understanding and agreement of plan as discussed.  All questions were addressed during visit.  Please see discharge instructions below for further details of plan.  Final Clinical Impressions(s) / UC Diagnoses   Final diagnoses:  Right temporomandibular joint disorder, unspecified     Discharge Instructions      He received an injection of ketorolac today which is a high dose anti-inflammatory pain medication that does not contain steroid.  Please begin Valium twice daily for the next 5 days.  Please begin Medrol Dosepak, take as prescribed for the next 6 days.  Please use naproxen after finishing the Medrol Dosepak should you still continue to have some pain.  Please follow-up with your primary care provider as well as your orthodontist to discuss temporomandibular joint disorder and strategies to  prevent this from reoccurring.  Thank you for visiting urgent care and I hope he feel better soon.     ED Prescriptions     Medication Sig Dispense Auth. Provider   diazepam (VALIUM) 5 MG tablet Take 1 tablet (5 mg total) by mouth 2 (two) times daily. 10 tablet Theadora Rama Scales, PA-C   methylPREDNISolone (MEDROL DOSEPAK) 4 MG TBPK tablet Take 24 mg on day 1, 20 mg on day 2, 16 mg on day 3, 12 mg on day 4, 8 mg on day 5, 4 mg on day 6. 21 tablet Theadora Rama Scales, PA-C   naproxen (NAPROSYN) 500 MG tablet Take 1 tablet (500 mg total) by mouth 2 (two) times daily. 30 tablet Theadora Rama Scales, PA-C      PDMP not reviewed this encounter.   Theadora Rama Scales, PA-C 07/13/21 [phone]

## 2021-07-13 NOTE — Discharge Instructions (Addendum)
He received an injection of ketorolac today which is a high dose anti-inflammatory pain medication that does not contain steroid.  Please begin Valium twice daily for the next 5 days.  Please begin Medrol Dosepak, take as prescribed for the next 6 days.  Please use naproxen after finishing the Medrol Dosepak should you still continue to have some pain.  Please follow-up with your primary care provider as well as your orthodontist to discuss temporomandibular joint disorder and strategies to prevent this from reoccurring.  Thank you for visiting urgent care and I hope he feel better soon.

## 2021-07-13 NOTE — ED Triage Notes (Signed)
Pt reports having pain to right ear radiating down to her jaw. Patient states pain is more intense with swallowing. Patient states there is no decrease in her hearing and she has had some dizziness.  Started: Tuesday  Home interventions: naproxen (decreased pain somewhat)

## 2021-09-22 ENCOUNTER — Emergency Department (HOSPITAL_COMMUNITY)
Admission: EM | Admit: 2021-09-22 | Discharge: 2021-09-23 | Disposition: A | Discharge status: Home / Self Care | Payer: No Typology Code available for payment source | Attending: Emergency Medicine | Admitting: Emergency Medicine

## 2021-09-22 ENCOUNTER — Emergency Department (HOSPITAL_COMMUNITY): Payer: No Typology Code available for payment source

## 2021-09-22 ENCOUNTER — Encounter (HOSPITAL_COMMUNITY): Payer: Self-pay | Admitting: Emergency Medicine

## 2021-09-22 DIAGNOSIS — R110 Nausea: Secondary | ICD-10-CM | POA: Insufficient documentation

## 2021-09-22 DIAGNOSIS — M79602 Pain in left arm: Secondary | ICD-10-CM | POA: Insufficient documentation

## 2021-09-22 DIAGNOSIS — R208 Other disturbances of skin sensation: Secondary | ICD-10-CM | POA: Insufficient documentation

## 2021-09-22 DIAGNOSIS — R0789 Other chest pain: Secondary | ICD-10-CM | POA: Insufficient documentation

## 2021-09-22 DIAGNOSIS — N9489 Other specified conditions associated with female genital organs and menstrual cycle: Secondary | ICD-10-CM | POA: Diagnosis not present

## 2021-09-22 DIAGNOSIS — Z4800 Encounter for change or removal of nonsurgical wound dressing: Secondary | ICD-10-CM | POA: Diagnosis present

## 2021-09-22 DIAGNOSIS — R42 Dizziness and giddiness: Secondary | ICD-10-CM | POA: Diagnosis not present

## 2021-09-22 LAB — CBC
HCT: 40.8 % (ref 36.0–46.0)
Hemoglobin: 12.4 g/dL (ref 12.0–15.0)
MCH: 22.8 pg — ABNORMAL LOW (ref 26.0–34.0)
MCHC: 30.4 g/dL (ref 30.0–36.0)
MCV: 75.1 fL — ABNORMAL LOW (ref 80.0–100.0)
Platelets: 249 10*3/uL (ref 150–400)
RBC: 5.43 MIL/uL — ABNORMAL HIGH (ref 3.87–5.11)
RDW: 14.3 % (ref 11.5–15.5)
WBC: 5 10*3/uL (ref 4.0–10.5)
nRBC: 0 % (ref 0.0–0.2)

## 2021-09-22 LAB — BASIC METABOLIC PANEL
Anion gap: 6 (ref 5–15)
BUN: 12 mg/dL (ref 6–20)
CO2: 25 mmol/L (ref 22–32)
Calcium: 9 mg/dL (ref 8.9–10.3)
Chloride: 107 mmol/L (ref 98–111)
Creatinine, Ser: 0.95 mg/dL (ref 0.44–1.00)
GFR, Estimated: >60 mL/min (ref >60)
Glucose, Bld: 108 mg/dL — ABNORMAL HIGH (ref 70–99)
Potassium: 4 mmol/L (ref 3.5–5.1)
Sodium: 138 mmol/L (ref 135–145)

## 2021-09-22 LAB — TROPONIN I (HIGH SENSITIVITY): Troponin I (High Sensitivity): <2 ng/L (ref <18)

## 2021-09-22 LAB — I-STAT BETA HCG BLOOD, ED (MC, WL, AP ONLY): I-stat hCG, quantitative: <5 m[IU]/mL (ref <5)

## 2021-09-22 NOTE — ED Triage Notes (Signed)
Patient arrives complaining of chest pain and dizziness. Patient states she noticed a wound on her left breast on Tuesday, which has gotten worse. Patient states that yesterday, she noted chest pain and today states the pain has gotten worse and she now has altered sensation in her left arm. Patient has FROM.

## 2021-09-22 NOTE — ED Provider Notes (Signed)
Emergency Medicine Provider Triage Evaluation Note  Meredith Reeves , a 40 y.o. female  was evaluated in triage.  Pt complains of bruising to the breast. Noted some bleeding for this area as well. Today had an episode of burning chest pain, dizziness and felt like her left hand was cold.  Review of Systems  Positive: Chest discomfort, dizziness, left hand coldness, bruising/wound to the left breast Negative: Fevers, cough  Physical Exam  BP (!) 154/111 (BP Location: Left Arm)    Pulse (!) 105    Resp 16    Ht 5\' 2"  (1.575 m)    Wt 81.6 kg    SpO2 100%    BMI 32.92 kg/m  Gen:   Awake, no distress   Resp:  Normal effort  MSK:   Moves extremities without difficulty  Other:  Heart with rrr, lungs ctab, 5/5 strength to the bue/ble, decreased sensation to the left hand, but remainder of lue has normal sensation. No facial droop. Clear speech  Medical Decision Making  Medically screening exam initiated at 9:53 PM.  Appropriate orders placed.  Meredith Reeves was informed that the remainder of the evaluation will be completed by another provider, this initial triage assessment does not replace that evaluation, and the importance of remaining in the ED until their evaluation is complete.     Azucena Cecil 09/22/21 2157    2158, MD 09/22/21 [phone]

## 2021-09-23 ENCOUNTER — Encounter (HOSPITAL_COMMUNITY): Payer: Self-pay

## 2021-09-23 ENCOUNTER — Emergency Department (HOSPITAL_COMMUNITY)
Admission: EM | Admit: 2021-09-23 | Discharge: 2021-09-23 | Disposition: A | Discharge status: Home / Self Care | Payer: No Typology Code available for payment source | Source: Home / Self Care

## 2021-09-23 DIAGNOSIS — Z5321 Procedure and treatment not carried out due to patient leaving prior to being seen by health care provider: Secondary | ICD-10-CM | POA: Insufficient documentation

## 2021-09-23 DIAGNOSIS — R42 Dizziness and giddiness: Secondary | ICD-10-CM | POA: Insufficient documentation

## 2021-09-23 DIAGNOSIS — M79602 Pain in left arm: Secondary | ICD-10-CM | POA: Insufficient documentation

## 2021-09-23 DIAGNOSIS — R0789 Other chest pain: Secondary | ICD-10-CM | POA: Insufficient documentation

## 2021-09-23 DIAGNOSIS — R110 Nausea: Secondary | ICD-10-CM | POA: Insufficient documentation

## 2021-09-23 LAB — TROPONIN I (HIGH SENSITIVITY): Troponin I (High Sensitivity): <2 ng/L (ref <18)

## 2021-09-23 NOTE — ED Triage Notes (Signed)
Pt reports that she was at [facility] and there for CP and dizziness, pain radiates to L arm and nausea.

## 2021-09-23 NOTE — ED Notes (Signed)
Pt stated "Imma go ahead and leave, y'all can just take me out" LWBS

## 2021-11-09 ENCOUNTER — Other Ambulatory Visit: Payer: Self-pay

## 2021-11-09 ENCOUNTER — Encounter (HOSPITAL_COMMUNITY): Payer: Self-pay

## 2021-11-09 ENCOUNTER — Ambulatory Visit (HOSPITAL_COMMUNITY)
Admission: EM | Admit: 2021-11-09 | Discharge: 2021-11-09 | Disposition: A | Discharge status: Home / Self Care | Payer: No Typology Code available for payment source | Attending: Physician Assistant | Admitting: Physician Assistant

## 2021-11-09 DIAGNOSIS — Z28310 Unvaccinated for covid-19: Secondary | ICD-10-CM | POA: Diagnosis not present

## 2021-11-09 DIAGNOSIS — J069 Acute upper respiratory infection, unspecified: Secondary | ICD-10-CM

## 2021-11-09 DIAGNOSIS — R52 Pain, unspecified: Secondary | ICD-10-CM | POA: Diagnosis not present

## 2021-11-09 DIAGNOSIS — J029 Acute pharyngitis, unspecified: Secondary | ICD-10-CM | POA: Insufficient documentation

## 2021-11-09 DIAGNOSIS — R051 Acute cough: Secondary | ICD-10-CM | POA: Insufficient documentation

## 2021-11-09 DIAGNOSIS — U071 COVID-19: Secondary | ICD-10-CM | POA: Insufficient documentation

## 2021-11-09 LAB — SARS CORONAVIRUS 2 (TAT 6-24 HRS): SARS Coronavirus 2: POSITIVE — AB

## 2021-11-09 LAB — POC INFLUENZA A AND B ANTIGEN (URGENT CARE ONLY)
INFLUENZA A ANTIGEN, POC: NEGATIVE
INFLUENZA B ANTIGEN, POC: NEGATIVE

## 2021-11-09 LAB — POCT RAPID STREP A, ED / UC: Streptococcus, Group A Screen (Direct): NEGATIVE

## 2021-11-09 MED ORDER — PREDNISONE 10 MG (21) PO TBPK: ORAL_TABLET | ORAL | 0 refills | Status: DC

## 2021-11-09 MED ORDER — PROMETHAZINE-DM 6.25-15 MG/5ML PO SYRP: 5.0000 mL | ORAL_SOLUTION | Freq: Two times a day (BID) | ORAL | 0 refills | Status: DC | PRN

## 2021-11-09 NOTE — ED Triage Notes (Signed)
Pt c/o productive cough with yellow sputum, sore throat, fatigue and body aches x2 days. States taking cold meds with relief.

## 2021-11-09 NOTE — Discharge Instructions (Signed)
Your flu and strep are negative.  We will contact you if your COVID test is positive.  Start prednisone taper to help with your symptoms.  Do not take NSAIDs including aspirin, ibuprofen/Advil, naproxen/Aleve with this medication as it can cause stomach bleeding.  Use Promethazine DM for cough.  This can make you sleepy so do not drive or drink alcohol with taking it.  Use Tylenol, Mucinex, Flonase for symptom relief.  Make sure you rest and drink plenty of fluid.  If your symptoms have not improved by next week please return here or see her PCP.  If anything worsens and you develop chest pain, high fever not responding to medication, shortness of breath, difficulty swallowing due to sore throat, weakness you need to go to the emergency room.

## 2021-11-09 NOTE — ED Provider Notes (Signed)
[facility]    CSN: [id number] Arrival date & time: 11/09/21  1014      History   Chief Complaint Chief Complaint  Patient presents with   Cough   Sore Throat    HPI Meredith Reeves is a 41 y.o. female.   Patient presents today with a 2-day history of URI symptoms.  Reports cough, sore throat, fatigue, body aches, malaise.  Denies any chest pain, shortness of breath, vomiting, abdominal pain.  She has tried over-the-counter medication including ibuprofen without improvement of symptoms.  Denies any known sick contacts.  She denies history of asthma, allergies, COPD, smoking.  She denies history of diabetes.  She has not had influenza or COVID-19 vaccines.  Denies history of COVID-19 infection.  Denies any recent antibiotic use.  She is eating and drinking normally.   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 Authorizing Provider  predniSONE (STERAPRED UNI-PAK 21 TAB) 10 MG (21) TBPK tablet As directed 11/09/21  Yes Laiah Pouncey K, PA-C  promethazine-dextromethorphan (PROMETHAZINE-DM) 6.25-15 MG/5ML syrup Take 5 mLs by mouth 2 (two) times daily as needed for cough. 11/09/21  Yes Debroah Shuttleworth, Derry Skill, PA-C  acyclovir (ZOVIRAX) 400 MG tablet Take 2 tablets (800 mg total)  by mouth 2 (two) times daily. 05/17/16   Ashley Murrain, NP  cyclobenzaprine (FLEXERIL) 10 MG tablet Take 10 mg by mouth 2 (two) times daily as needed for muscle spasms.     [provider]  diazepam (VALIUM) 5 MG tablet Take 1 tablet (5 mg total) by mouth 2 (two) times daily. Patient taking differently: Take 5 mg by mouth daily as needed for anxiety.  11/24/13   Noe Gens, PA-C  diazepam (VALIUM) 5 MG tablet Take 1 tablet (5 mg total) by mouth 2 (two) times daily. 07/13/21   Lynden Oxford Scales, PA-C  fluconazole (DIFLUCAN) 200 MG tablet Take 1 tablet (200 mg total) by mouth daily. 01/19/17   Malvin Johns, MD  fluticasone (FLONASE) 50 MCG/ACT nasal spray Place 2 sprays into both nostrils daily. 07/13/17   Jacqlyn Larsen, PA-C  gabapentin (NEURONTIN) 600 MG tablet Take 600 mg by mouth 3 (three) times daily. 06/29/21   [provider]  hydrOXYzine (ATARAX/VISTARIL) 25 MG tablet Take 1 tablet (25 mg total) by mouth 3 (three) times daily as needed for itching. 11/04/15   Boykin Nearing, MD  medroxyPROGESTERone (DEPO-PROVERA) 150 MG/ML injection Inject 150 mg into the muscle every 3 (three) months.  10/16/14   [provider]  omeprazole (PRILOSEC) 20 MG capsule Take 1 capsule (20 mg total) by mouth daily. 02/23/21   Palumbo, April, MD  oxyCODONE-acetaminophen (PERCOCET/ROXICET) 5-325 MG tablet Take 1 tablet by mouth every 6 (six) hours as needed for severe pain. 01/19/17   Malvin Johns, MD  Vitamin D, Ergocalciferol, (DRISDOL) 1.25 MG (50000 UNIT) CAPS capsule Take 50,000 Units by mouth once a week. 06/23/21   [provider]    Family History Family History  Problem Relation Age of Onset   Alcohol abuse Father    Heart disease Father    Hyperlipidemia Father    Anesthesia problems Neg Hx     Social History Social History   Tobacco Use   Smoking status: Never   Smokeless tobacco: Never  Vaping Use   Vaping Use: Never used  Substance Use Topics    Alcohol use: Yes    Comment: socially   Drug use: No     Allergies   Ibuprofen and Vicodin [hydrocodone-acetaminophen]   Review of Systems Review of Systems  Constitutional:  Positive for activity change. Negative for appetite change, fatigue and fever.  HENT:  Positive for congestion and sore throat. Negative for sinus pressure and sneezing.   Respiratory:  Positive for cough. Negative for shortness of breath.   Cardiovascular:  Negative for chest pain.  Gastrointestinal:  Positive for nausea. Negative for abdominal pain, diarrhea and vomiting.  Musculoskeletal:  Positive for arthralgias and myalgias.  Neurological:  Positive for headaches. Negative for dizziness and light-headedness.    Physical Exam Triage Vital Signs ED Triage Vitals  Enc Vitals Group     BP 11/09/21 1057 132/89     Pulse Rate 11/09/21 1057 88     Resp 11/09/21 1057 18     Temp 11/09/21 1057 99.8 F (37.7 C)     Temp Source 11/09/21 1057 Oral     SpO2 11/09/21 1057 100 %     Weight --      Height --      Head Circumference --      Peak Flow --      Pain Score 11/09/21 1058 10     Pain Loc --      Pain Edu? --      Excl. in [place]? --    No data found.  Updated Vital Signs BP 132/89 (BP Location: Left Arm)    Pulse 88    Temp 99.8 F (37.7 C) (Oral)    Resp 18    SpO2 100%   Visual Acuity Right Eye Distance:   Left Eye Distance:   Bilateral Distance:    Right Eye Near:   Left Eye Near:    Bilateral Near:     Physical Exam Vitals reviewed.  Constitutional:      General: She is awake. She is not in acute distress.    Appearance: Normal appearance. She is well-developed. She is not ill-appearing.     Comments: Very pleasant female appears stated age in no acute distress  HENT:     Head: Normocephalic and atraumatic.     Right Ear: Tympanic membrane, ear canal and external ear normal. Tympanic membrane is not erythematous or bulging.     Left Ear: Tympanic membrane, ear canal and external  ear normal. Tympanic membrane is not erythematous or bulging.     Nose:  Right Sinus: No maxillary sinus tenderness or frontal sinus tenderness.     Left Sinus: No maxillary sinus tenderness or frontal sinus tenderness.     Mouth/Throat:     Pharynx: Uvula midline. Posterior oropharyngeal erythema present. No oropharyngeal exudate.     Tonsils: No tonsillar exudate or tonsillar abscesses. 1+ on the right. 1+ on the left.  Cardiovascular:     Rate and Rhythm: Normal rate and regular rhythm.     Heart sounds: Normal heart sounds, S1 normal and S2 normal. No murmur heard. Pulmonary:     Effort: Pulmonary effort is normal.     Breath sounds: Normal breath sounds. No wheezing, rhonchi or rales.     Comments: Clear to auscultation bilaterally Psychiatric:        Behavior: Behavior is cooperative.     UC Treatments / Results  Labs (all labs ordered are listed, but only abnormal results are displayed) Labs Reviewed  CULTURE, GROUP A STREP ([place])  SARS CORONAVIRUS 2 (TAT 6-24 HRS)  POC INFLUENZA A AND B ANTIGEN (URGENT CARE ONLY)  POCT RAPID STREP A, ED / UC    EKG   Radiology No results found.  Procedures Procedures (including critical care time)  Medications Ordered in UC Medications - No data to display  Initial Impression / Assessment and Plan / UC Course  I have reviewed the triage vital signs and the nursing notes.  Pertinent labs & imaging results that were available during my care of the patient were reviewed by me and considered in my medical decision making (see chart for details).     Patient is well-appearing, nontoxic, afebrile, nontachycardic.  Discussed likely viral etiology given clinical presentation.  No evidence of acute infection that warrant initiation of antibiotics.  Flu and strep were negative.  Throat culture and COVID test are pending.  She was provided work excuse note with current CDC return to work guidelines based on COVID test result.  We will  start prednisone taper to help manage symptoms and she was instructed not to take NSAIDs with this medication due to risk of GI bleeding.  She was prescribed Promethazine DM for cough with instruction not to drive or drink alcohol while taking this medication as drowsiness is a common side effect.  She is to use Mucinex, Flonase, Tylenol for additional symptom relief.  Recommended she rest and drink plenty of fluid.  Discussed that if she has any worsening symptoms including high fever, chest pain, shortness of breath, worsening sore throat interfering with swallowing, weakness she needs to be seen immediately.  Strict return precautions given to which she expressed understanding.  Recommended she follow-up with her primary care next week if symptoms have not resolved.  Final Clinical Impressions(s) / UC Diagnoses   Final diagnoses:  Upper respiratory tract infection, unspecified type  Acute cough  Sore throat  Body aches     Discharge Instructions      Your flu and strep are negative.  We will contact you if your COVID test is positive.  Start prednisone taper to help with your symptoms.  Do not take NSAIDs including aspirin, ibuprofen/Advil, naproxen/Aleve with this medication as it can cause stomach bleeding.  Use Promethazine DM for cough.  This can make you sleepy so do not drive or drink alcohol with taking it.  Use Tylenol, Mucinex, Flonase for symptom relief.  Make sure you rest and drink plenty of fluid.  If your symptoms have not improved by next week please return here  or see her PCP.  If anything worsens and you develop chest pain, high fever not responding to medication, shortness of breath, difficulty swallowing due to sore throat, weakness you need to go to the emergency room.     ED Prescriptions     Medication Sig Dispense Auth. Provider   promethazine-dextromethorphan (PROMETHAZINE-DM) 6.25-15 MG/5ML syrup Take 5 mLs by mouth 2 (two) times daily as needed for cough. 118 mL  Ashten Sarnowski K, PA-C   predniSONE (STERAPRED UNI-PAK 21 TAB) 10 MG (21) TBPK tablet As directed 21 tablet Elfreida Heggs K, PA-C      PDMP not reviewed this encounter.   Terrilee Croak, PA-C 11/09/21 1214

## 2021-11-10 ENCOUNTER — Ambulatory Visit (HOSPITAL_COMMUNITY): Payer: Self-pay

## 2021-11-12 LAB — CULTURE, GROUP A STREP (THRC)

## 2021-11-13 ENCOUNTER — Other Ambulatory Visit: Payer: Self-pay | Admitting: Internal Medicine

## 2021-11-14 LAB — COMPLETE METABOLIC PANEL WITH GFR
AG Ratio: 1.2 (calc) (ref 1.0–2.5)
ALT: 36 U/L — ABNORMAL HIGH (ref 6–29)
AST: 21 U/L (ref 10–30)
Albumin: 4.1 g/dL (ref 3.6–5.1)
Alkaline phosphatase (APISO): 59 U/L (ref 31–125)
BUN: 13 mg/dL (ref 7–25)
CO2: 21 mmol/L (ref 20–32)
Calcium: 9.3 mg/dL (ref 8.6–10.2)
Chloride: 104 mmol/L (ref 98–110)
Creat: 0.69 mg/dL (ref 0.50–0.99)
Globulin: 3.4 g/dL (calc) (ref 1.9–3.7)
Glucose, Bld: 93 mg/dL (ref 65–99)
Potassium: 3.7 mmol/L (ref 3.5–5.3)
Sodium: 139 mmol/L (ref 135–146)
Total Bilirubin: 0.4 mg/dL (ref 0.2–1.2)
Total Protein: 7.5 g/dL (ref 6.1–8.1)
eGFR: 112 mL/min/{1.73_m2} (ref >=60)

## 2021-11-14 LAB — LIPID PANEL
Cholesterol: 177 mg/dL (ref <200)
HDL: 53 mg/dL (ref >=50)
LDL Cholesterol (Calc): 95 mg/dL (calc)
Non-HDL Cholesterol (Calc): 124 mg/dL (calc) (ref <130)
Total CHOL/HDL Ratio: 3.3 (calc) (ref <5.0)
Triglycerides: 193 mg/dL — ABNORMAL HIGH (ref <150)

## 2021-11-14 LAB — VITAMIN D 25 HYDROXY (VIT D DEFICIENCY, FRACTURES): Vit D, 25-Hydroxy: 43 ng/mL (ref 30–100)

## 2021-11-14 LAB — CBC
HCT: 42 % (ref 35.0–45.0)
Hemoglobin: 12.6 g/dL (ref 11.7–15.5)
MCH: 21.7 pg — ABNORMAL LOW (ref 27.0–33.0)
MCHC: 30 g/dL — ABNORMAL LOW (ref 32.0–36.0)
MCV: 72.4 fL — ABNORMAL LOW (ref 80.0–100.0)
MPV: 12.4 fL (ref 7.5–12.5)
Platelets: 252 10*3/uL (ref 140–400)
RBC: 5.8 10*6/uL — ABNORMAL HIGH (ref 3.80–5.10)
RDW: 13.8 % (ref 11.0–15.0)
WBC: 8.4 10*3/uL (ref 3.8–10.8)

## 2021-11-14 LAB — TSH: TSH: 1.01 mIU/L

## 2023-07-05 ENCOUNTER — Encounter (HOSPITAL_COMMUNITY): Payer: Self-pay

## 2023-07-05 ENCOUNTER — Ambulatory Visit (HOSPITAL_COMMUNITY)
Admission: EM | Admit: 2023-07-05 | Discharge: 2023-07-05 | Disposition: A | Discharge status: Home / Self Care | Payer: No Typology Code available for payment source | Attending: Internal Medicine | Admitting: Internal Medicine

## 2023-07-05 DIAGNOSIS — M5442 Lumbago with sciatica, left side: Secondary | ICD-10-CM | POA: Diagnosis not present

## 2023-07-05 MED ORDER — BACLOFEN 10 MG PO TABS: 10.0000 mg | ORAL_TABLET | Freq: Three times a day (TID) | ORAL | 0 refills | Status: DC

## 2023-07-05 MED ORDER — KETOROLAC TROMETHAMINE 30 MG/ML IJ SOLN
30.0000 mg | Freq: Once | INTRAMUSCULAR | Status: AC
  Administered 2023-07-05: 30 mg via INTRAMUSCULAR

## 2023-07-05 MED ORDER — PREDNISONE 20 MG PO TABS: ORAL_TABLET | ORAL | 0 refills | Status: AC

## 2023-07-05 MED ORDER — DEXAMETHASONE SODIUM PHOSPHATE 10 MG/ML IJ SOLN
10.0000 mg | Freq: Once | INTRAMUSCULAR | Status: AC
  Administered 2023-07-05: 10 mg via INTRAMUSCULAR

## 2023-07-05 MED ORDER — KETOROLAC TROMETHAMINE 30 MG/ML IJ SOLN
INTRAMUSCULAR | Status: AC
  Filled 2023-07-05: qty 1

## 2023-07-05 MED ORDER — DEXAMETHASONE SODIUM PHOSPHATE 10 MG/ML IJ SOLN
INTRAMUSCULAR | Status: AC
  Filled 2023-07-05: qty 1

## 2023-07-05 NOTE — ED Triage Notes (Signed)
Patient reports that 5 days ago she bent over and heard a pop in her left lower back. Patient states the pain began radiating down the left leg to the left knee. 3 days ago, the patient states she went to her Chiropractor and that night she had to call the Telephysician and received a prescription for methylprednisolone. Patient states the prescribed medication is not working.

## 2023-07-05 NOTE — Discharge Instructions (Signed)
Your low back pain is due to sciatic nerve pain. I gave you a steroid shot in the clinic to help reduce inflammation and pain. Start taking prednisone as prescribed starting tomorrow, do no take any ibuprofen or steroid until tomorrow since I gave you the shot in the clinic. Do not take ibuprofen while taking steroid, taking these medicines together can cause stomach upset. Take muscle relaxer at bedtime as needed for muscle spasm.  No that the muscle relaxer may make you sleepy, so do not take this during the daytime or when drinking/driving.  Apply heat to the low back and use gentle range of motion exercises to prevent stiffness to the area.  Please schedule an appointment for follow-up with your primary care provider or the orthopedic provider listed on your paperwork.

## 2023-07-05 NOTE — ED Provider Notes (Signed)
[facility]    CSN: [id number] Arrival date & time: 07/05/23  1420      History   Chief Complaint Chief Complaint  Patient presents with   Back Pain    HPI Meredith Reeves is a 42 y.o. female.   Meredith Reeves is a 42 y.o. female presenting for chief complaint of pain to the left low back that started approximately 6 days ago.  Patient bent forward to pick up her dog off the floor and heard a pop to her left lower back when she stood back up.  She was able to walk and states pain was tolerable after injury.  The next day on Monday, July 01, 2023, she went to her chiropractor states chiropractic adjustment did not help very much with pain and may have made it worse.  The next day on Tuesday, September 24, she was able to do her daily stretches and went for a run in the park without difficulty but then experienced progressively worsening pain after her exercise in the evening.  She then did a telehealth visit where she was prescribed methylprednisolone steroid Dosepak.  She has been taking methylprednisolone steroid as prescribed without relief and is on her second day of the Dosepak.  She has taken two 4 mg methylprednisolone pills today.  Describes pain to the left low back as a dull, pressure, and worsened by movement.  Pain intermittently radiates down the left leg and into the left knee. No fall, trauma, numbness or tingling, saddle paresthesia, changes to bowel or urinary habits, extremity weakness. Taking ibuprofen, tylenol without relief.    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 Authorizing Provider  baclofen (LIORESAL) 10 MG tablet Take 1 tablet (10 mg total) by mouth 3 (three) times daily. 07/05/23  Yes Carlisle Beers, FNP  predniSONE (DELTASONE) 20 MG tablet Take 2 tablets (40 mg total) by mouth daily for 3 days, THEN 1 tablet (20 mg total) daily for 3 days, THEN 0.5 tablets (10 mg total) daily for 3 days. 07/05/23 07/14/23 Yes Jordanna Hendrie, Donavan Burnet, FNP  acyclovir (ZOVIRAX) 400 MG tablet Take 2 tablets (800 mg total) by mouth 2 (two) times daily. 05/17/16   Janne Napoleon, NP  cyclobenzaprine (FLEXERIL) 10 MG tablet Take 10 mg by mouth 2 (two) times daily as needed for muscle spasms.  [provider]  diazepam (VALIUM) 5 MG tablet Take 1 tablet (5 mg total) by mouth 2 (two) times daily. Patient taking differently: Take 5 mg by mouth daily as needed for anxiety.  11/24/13   Lurene Shadow, PA-C  diazepam (VALIUM) 5 MG tablet Take 1 tablet (5 mg total) by mouth 2 (two) times daily. 07/13/21   Theadora Rama Scales, PA-C  fluconazole (DIFLUCAN) 200 MG tablet Take 1 tablet (200 mg total) by mouth daily. 01/19/17   Rolan Bucco, MD  fluticasone (FLONASE) 50 MCG/ACT nasal spray Place 2 sprays into both nostrils daily. 07/13/17   Dartha Lodge, PA-C  gabapentin (NEURONTIN) 600 MG tablet Take 600 mg by mouth 3 (three) times daily. 06/29/21   [provider]  hydrOXYzine (ATARAX/VISTARIL) 25 MG tablet Take 1 tablet (25 mg total) by mouth 3 (three) times daily as needed for itching. 11/04/15   Funches, Gerilyn Nestle, MD  medroxyPROGESTERone (DEPO-PROVERA) 150 MG/ML injection Inject 150 mg into the muscle every 3  (three) months.  10/16/14   [provider]  omeprazole (PRILOSEC) 20 MG capsule Take 1 capsule (20 mg total) by mouth daily. 02/23/21   Palumbo, April, MD  oxyCODONE-acetaminophen (PERCOCET/ROXICET) 5-325 MG tablet Take 1 tablet by mouth every 6 (six) hours as needed for severe pain. 01/19/17   Rolan Bucco, MD  promethazine-dextromethorphan (PROMETHAZINE-DM) 6.25-15 MG/5ML syrup Take 5 mLs by mouth 2 (two) times daily as needed for cough. 11/09/21   Raspet, Noberto Retort, PA-C  Vitamin D, Ergocalciferol, (DRISDOL) 1.25 MG (50000 UNIT) CAPS capsule Take 50,000 Units by mouth once a week. 06/23/21   [provider]    Family History Family History  Problem Relation Age of Onset   Alcohol abuse Father    Heart disease Father    Hyperlipidemia Father    Anesthesia problems Neg Hx     Social History Social History   Tobacco Use   Smoking status: Never   Smokeless tobacco: Never  Vaping Use   Vaping status: Never Used  Substance Use Topics   Alcohol use: Yes    Comment: socially   Drug use: No     Allergies   Ibuprofen and Vicodin [hydrocodone-acetaminophen]   Review of Systems Review of Systems  Musculoskeletal:  Positive for back pain.  Per HPI   Physical Exam Triage Vital Signs ED Triage Vitals  Encounter Vitals Group     BP 07/05/23 1450 118/83     Systolic BP Percentile --      Diastolic BP Percentile --      Pulse Rate 07/05/23 1450 98     Resp 07/05/23 1450 16     Temp 07/05/23 1450 98.1 F (36.7 C)     Temp Source 07/05/23 1450 Oral     SpO2 07/05/23 1450 94 %     Weight --      Height --      Head Circumference --      Peak Flow --      Pain Score 07/05/23 1452 7     Pain Loc --      Pain Education --      Exclude from Growth Chart --    No data found.  Updated Vital Signs BP 118/83 (BP Location: Right Arm)   Pulse 98   Temp 98.1 F (36.7 C) (Oral)   Resp 16   SpO2 94%   Visual Acuity Right Eye Distance:   Left Eye Distance:    Bilateral Distance:  Right Eye Near:   Left Eye Near:    Bilateral Near:     Physical Exam Vitals and nursing note reviewed.  Constitutional:      Appearance: She is not ill-appearing or toxic-appearing.  HENT:     Head: Normocephalic and atraumatic.     Right Ear: Hearing and external ear normal.     Left Ear: Hearing and external ear normal.     Nose: Nose normal.     Mouth/Throat:     Lips: Pink.  Eyes:     General: Lids are normal. Vision grossly intact. Gaze aligned appropriately.     Extraocular Movements: Extraocular movements intact.     Conjunctiva/sclera: Conjunctivae normal.  Cardiovascular:     Rate and Rhythm: Normal rate and regular rhythm.     Heart sounds: Normal heart sounds, S1 normal and S2 normal.  Pulmonary:     Effort: Pulmonary effort is normal. No respiratory distress.     Breath sounds: Normal breath sounds and air entry.  Musculoskeletal:     Cervical back: Normal and neck supple.     Thoracic back: Normal.     Lumbar back: Tenderness (TTP to the left sacroiliac joint and the paraspinals of the left lumbar spine.) present. No swelling, edema, deformity, signs of trauma, lacerations, spasms or bony tenderness. Normal range of motion. Negative right straight leg raise test and negative left straight leg raise test. No scoliosis.  Skin:    General: Skin is warm and dry.     Capillary Refill: Capillary refill takes less than 2 seconds.     Findings: No rash.  Neurological:     General: No focal deficit present.     Mental Status: She is alert and oriented to person, place, and time. Mental status is at baseline.     Cranial Nerves: Cranial nerves 2-12 are intact. No dysarthria or facial asymmetry.     Sensory: Sensation is intact.     Motor: Motor function is intact.     Coordination: Coordination is intact.     Gait: Gait is intact.     Comments: Strength and sensation intact to bilateral upper and lower extremities (5/5). Moves all 4 extremities  with normal coordination voluntarily. Non-focal neuro exam.   Psychiatric:        Mood and Affect: Mood normal.        Speech: Speech normal.        Behavior: Behavior normal.        Thought Content: Thought content normal.        Judgment: Judgment normal.      UC Treatments / Results  Labs (all labs ordered are listed, but only abnormal results are displayed) Labs Reviewed - No data to display  EKG   Radiology No results found.  Procedures Procedures (including critical care time)  Medications Ordered in UC Medications  dexamethasone (DECADRON) injection 10 mg (10 mg Intramuscular Given 07/05/23 1551)  ketorolac (TORADOL) 30 MG/ML injection 30 mg (30 mg Intramuscular Given 07/05/23 1550)    Initial Impression / Assessment and Plan / UC Course  I have reviewed the triage vital signs and the nursing notes.  Pertinent labs & imaging results that were available during my care of the patient were reviewed by me and considered in my medical decision making (see chart for details).   1.  Acute left-sided low back pain with left-sided sciatica Evaluation suggests sciatic nerve pain.  No red flag signs/symptoms found on exam indicating need  for referral to ED for further workup.  Deferred imaging based on atraumatic mechanism of injury.  Given ketorolac 30 mg IM and dexamethasone 10 mg IM in clinic for acute pain. Pain has not responded well to NSAIDs, therefore will treat with short course of steroid to be started tomorrow.  Advised patient to stop taking methylprednisolone Dosepak and start 9-day steroid taper tomorrow.  Advised to take with food to avoid stomach upset. Muscle relaxer as needed for muscular involvement, drowsiness precautions discussed. Follow-up with orthopedics as needed, walking referral given.  Work excuse note given.  Counseled patient on potential for adverse effects with medications prescribed/recommended today, strict ER and return-to-clinic  precautions discussed, patient verbalized understanding.    Final Clinical Impressions(s) / UC Diagnoses   Final diagnoses:  Acute left-sided low back pain with left-sided sciatica     Discharge Instructions      Your low back pain is due to sciatic nerve pain. I gave you a steroid shot in the clinic to help reduce inflammation and pain. Start taking prednisone as prescribed starting tomorrow, do no take any ibuprofen or steroid until tomorrow since I gave you the shot in the clinic. Do not take ibuprofen while taking steroid, taking these medicines together can cause stomach upset. Take muscle relaxer at bedtime as needed for muscle spasm.  No that the muscle relaxer may make you sleepy, so do not take this during the daytime or when drinking/driving.  Apply heat to the low back and use gentle range of motion exercises to prevent stiffness to the area.  Please schedule an appointment for follow-up with your primary care provider or the orthopedic provider listed on your paperwork.      ED Prescriptions     Medication Sig Dispense Auth. Provider   baclofen (LIORESAL) 10 MG tablet Take 1 tablet (10 mg total) by mouth 3 (three) times daily. 30 each Carlisle Beers, FNP   predniSONE (DELTASONE) 20 MG tablet Take 2 tablets (40 mg total) by mouth daily for 3 days, THEN 1 tablet (20 mg total) daily for 3 days, THEN 0.5 tablets (10 mg total) daily for 3 days. 10.5 tablet Carlisle Beers, FNP      PDMP not reviewed this encounter.   Carlisle Beers, [place] 07/05/23 1554

## 2024-09-14 ENCOUNTER — Encounter (HOSPITAL_COMMUNITY): Payer: Self-pay

## 2024-09-14 ENCOUNTER — Ambulatory Visit (HOSPITAL_COMMUNITY)

## 2024-09-14 ENCOUNTER — Ambulatory Visit (HOSPITAL_COMMUNITY): Admission: EM | Admit: 2024-09-14 | Discharge: 2024-09-14 | Disposition: A | Discharge status: Home / Self Care

## 2024-09-14 DIAGNOSIS — K219 Gastro-esophageal reflux disease without esophagitis: Secondary | ICD-10-CM

## 2024-09-14 DIAGNOSIS — R0789 Other chest pain: Secondary | ICD-10-CM

## 2024-09-14 LAB — BASIC METABOLIC PANEL WITH GFR
Anion gap: 8 (ref 5–15)
BUN: 8 mg/dL (ref 6–20)
CO2: 24 mmol/L (ref 22–32)
Calcium: 8.6 mg/dL — ABNORMAL LOW (ref 8.9–10.3)
Chloride: 107 mmol/L (ref 98–111)
Creatinine, Ser: 0.72 mg/dL (ref 0.44–1.00)
GFR, Estimated: >60 mL/min (ref >60)
Glucose, Bld: 77 mg/dL (ref 70–99)
Potassium: 4.5 mmol/L (ref 3.5–5.1)
Sodium: 139 mmol/L (ref 135–145)

## 2024-09-14 LAB — CBC
HCT: 40.3 % (ref 36.0–46.0)
Hemoglobin: 12 g/dL (ref 12.0–15.0)
MCH: 22.1 pg — ABNORMAL LOW (ref 26.0–34.0)
MCHC: 29.8 g/dL — ABNORMAL LOW (ref 30.0–36.0)
MCV: 74.2 fL — ABNORMAL LOW (ref 80.0–100.0)
Platelets: 275 K/uL (ref 150–400)
RBC: 5.43 MIL/uL — ABNORMAL HIGH (ref 3.87–5.11)
RDW: 14.6 % (ref 11.5–15.5)
WBC: 6.5 K/uL (ref 4.0–10.5)
nRBC: 0 % (ref 0.0–0.2)

## 2024-09-14 MED ORDER — LIDOCAINE VISCOUS HCL 2 % MT SOLN
5.0000 mL | Freq: Once | OROMUCOSAL | Status: AC
  Administered 2024-09-14: 5 mL via OROMUCOSAL

## 2024-09-14 MED ORDER — ALUM & MAG HYDROXIDE-SIMETH 200-200-20 MG/5ML PO SUSP
ORAL | Status: AC
  Filled 2024-09-14: qty 30

## 2024-09-14 MED ORDER — ALUM & MAG HYDROXIDE-SIMETH 200-200-20 MG/5ML PO SUSP
30.0000 mL | Freq: Once | ORAL | Status: AC
  Administered 2024-09-14: 30 mL via ORAL

## 2024-09-14 MED ORDER — LIDOCAINE VISCOUS HCL 2 % MT SOLN
OROMUCOSAL | Status: AC
  Filled 2024-09-14: qty 15

## 2024-09-14 MED ORDER — FAMOTIDINE 20 MG PO TABS: 20.0000 mg | ORAL_TABLET | Freq: Two times a day (BID) | ORAL | 0 refills | Status: AC

## 2024-09-14 NOTE — Discharge Instructions (Addendum)
 EKG negative for acute findings.  Chest x-ray significant for chronic but enlarging mediastinal soft tissue.  Recommend further characterization with a chest CT with IV contrast. Lab work obtained, you will be notified via telephone if there are abnormalities.  You have been given a prescription for famotidine .  Take 1 tablet by mouth twice daily for reflux. Take Tums for breakthrough reflux symptoms.  If you develop any new or worsening symptoms or if your symptoms do not start to improve, please return here or follow-up with your primary care provider.  If your symptoms are severe, please go to the emergency room.

## 2024-09-14 NOTE — ED Triage Notes (Signed)
 Pt c/o chest congestion since last night. States starts at top of throat down to chest. States it feels cold and burning. States tums with no relief.

## 2024-09-14 NOTE — ED Provider Notes (Signed)
 [facility]    CSN: [id number] Arrival date & time: 09/14/24  1005      History   Chief Complaint Chief Complaint  Patient presents with   chest congestion    HPI Meredith Reeves is a 43 y.o. female.   This 43 year old female is being seen for complaints of cold and burning sensation from throat to stomach.  She reports sudden onset yesterday when laying down.  She has taken Tums without relief.  She says she has a prescription for omeprazole , but does not take this due to it being ineffective.  She denies ear pain, nasal congestion, sore throat.  She denies fever, chills.  She denies shortness of breath, cough, chest pain.  She denies abdominal pain, nausea, vomiting, constipation, or diarrhea.  The history is provided by the patient.    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 Authorizing Provider  amitriptyline (ELAVIL) 50 MG tablet Take 25-50 mg by mouth at bedtime as needed. 08/24/24  Yes [provider]  diazepam  (VALIUM ) 5 MG tablet Take 1 tablet (5 mg total) by mouth 2 (two) times daily. Patient taking differently: Take 5 mg by mouth daily as needed for anxiety.  11/24/13   Anitra Rocky KIDD, PA-C  fluticasone  (FLONASE ) 50 MCG/ACT nasal spray Place 2  sprays into both nostrils daily. 07/13/17   Alva Larraine FALCON, PA-C  gabapentin (NEURONTIN) 600 MG tablet Take 600 mg by mouth 3 (three) times daily. 06/29/21   [provider]  medroxyPROGESTERone (DEPO-PROVERA) 150 MG/ML injection Inject 150 mg into the muscle every 3 (three) months.  10/16/14   [provider]    Family History Family History  Problem Relation Age of Onset   Alcohol abuse Father    Heart disease Father    Hyperlipidemia Father    Anesthesia problems Neg Hx     Social History Social History   Tobacco Use   Smoking status: Never   Smokeless tobacco: Never  Vaping Use   Vaping status: Never Used  Substance Use Topics   Alcohol use: Yes    Comment: socially   Drug use: No     Allergies   Ibuprofen  and Vicodin [hydrocodone -acetaminophen ]   Review of Systems Review of Systems  Constitutional:  Negative for appetite change, chills and fever.  HENT:  Negative for congestion, ear pain, sore throat, trouble swallowing and voice  change.   Respiratory:  Negative for cough and shortness of breath.   Cardiovascular:  Negative for chest pain.  Gastrointestinal:  Negative for abdominal pain, constipation, diarrhea, nausea and vomiting.  Genitourinary:  Negative for difficulty urinating, dysuria, frequency and urgency.  Musculoskeletal:  Negative for arthralgias and back pain.  Skin:  Negative for color change and rash.  Neurological:  Negative for dizziness and headaches.  All other systems reviewed and are negative.    Physical Exam Triage Vital Signs ED Triage Vitals  Encounter Vitals Group     BP      Girls Systolic BP Percentile      Girls Diastolic BP Percentile      Boys Systolic BP Percentile      Boys Diastolic BP Percentile      Pulse      Resp      Temp      Temp src      SpO2      Weight      Height      Head Circumference      Peak Flow      Pain Score      Pain Loc      Pain Education      Exclude from Growth Chart     No data found.  Updated Vital Signs BP 135/87 (BP Location: Left Arm)   Pulse 84   Temp 98.3 F (36.8 C) (Oral)   Resp 18   SpO2 96%   Visual Acuity Right Eye Distance:   Left Eye Distance:   Bilateral Distance:    Right Eye Near:   Left Eye Near:    Bilateral Near:     Physical Exam Vitals and nursing note reviewed.  Constitutional:      General: She is not in acute distress.    Appearance: She is well-developed. She is not ill-appearing or toxic-appearing.     Comments: Pleasant female appearing stated age found sitting in chair in no acute distress.  HENT:     Head: Normocephalic and atraumatic.     Right Ear: Tympanic membrane normal.     Left Ear: Tympanic membrane normal.     Nose: Nose normal.     Mouth/Throat:     Lips: Pink.     Mouth: Mucous membranes are moist.     Pharynx: Oropharynx is clear. No oropharyngeal exudate or posterior oropharyngeal erythema.  Eyes:     Conjunctiva/sclera: Conjunctivae normal.  Cardiovascular:     Rate and Rhythm: Normal rate and regular rhythm.     Heart sounds: Normal heart sounds. No murmur heard. Pulmonary:     Effort: Pulmonary effort is normal. No respiratory distress.     Breath sounds: Normal breath sounds.  Abdominal:     General: Bowel sounds are normal.     Palpations: Abdomen is soft.     Tenderness: There is no abdominal tenderness.  Musculoskeletal:        General: No swelling.  Skin:    General: Skin is warm and dry.     Capillary Refill: Capillary refill takes less than 2 seconds.  Neurological:     Mental Status: She is alert.  Psychiatric:        Mood and Affect: Mood normal.      UC Treatments / Results  Labs (all labs ordered are listed, but only abnormal results are displayed) Labs Reviewed  CBC  BASIC METABOLIC PANEL WITH GFR    EKG  Radiology DG Chest 2 View Result Date: 09/14/2024 CLINICAL DATA:  Chest discomfort. EXAM: CHEST - 2 VIEW COMPARISON:  07/13/2017 and 07/23/2021  FINDINGS: Again noted is soft tissue enlargement in the superior mediastinum. Trachea is just right of midline but unchanged. The mediastinal widening measures up to 10.3 cm and measured 8.3 cm in 2018. Heart size is normal. Lungs are clear without focal airspace disease or pulmonary edema. No large pleural effusions. No acute bone abnormality. Negative for a pneumothorax. Mild elevation of the right hemidiaphragm is again noted. IMPRESSION: 1. Chronic but enlarging mediastinal soft tissue. Mediastinal tissue has enlarged since 2018. Findings are nonspecific and differential diagnosis includes mediastinal mass, lymphadenopathy and thyroid  goiter. Recommend further characterization with a chest CT with IV contrast. 2. No acute lung findings. Electronically Signed   By: Juliene Balder M.D.   On: 09/14/2024 13:07    Procedures Procedures (including critical care time)  Medications Ordered in UC Medications  lidocaine  (XYLOCAINE ) 2 % viscous mouth solution 5 mL (5 mLs Mouth/Throat Given 09/14/24 1137)  alum & mag hydroxide-simeth (MAALOX/MYLANTA) 200-200-20 MG/5ML suspension 30 mL (30 mLs Oral Given 09/14/24 1137)    Initial Impression / Assessment and Plan / UC Course  I have reviewed the triage vital signs and the nursing notes.  Pertinent labs & imaging results that were available during my care of the patient were reviewed by me and considered in my medical decision making (see chart for details).     Vitals and triage reviewed, patient is hemodynamically stable.  Suspect a GERD exacerbation.  She is provided GI cocktail in clinic today.  She denies any improvement in symptoms with a GI cocktail.  Upon further discussion, she reports chest discomfort described as burning and itching in her chest.  CBC, BMP obtained.  She will be notified via telephone for any abnormalities.  Chest x-ray obtained, significant for chronic but enlarging mediastinal soft tissue; recommend further characterization with CT  chest with contrast..  She reports history of thyroid  nodule, followed by her PCP.  EKG obtained with no acute findings.  She reports noncompliance with omeprazole  due to it being ineffective.  Provided prescription for famotidine .  She is advised to take Tums as needed for breakthrough reflux symptoms.  She is advised to follow-up with her primary care for further evaluation of mediastinal soft tissue and chest with IV contrast.  She is provided with printout of chest x-ray results.  Plan of care, follow-up care, return precautions given, no questions at this time.  Work note is provided. Final Clinical Impressions(s) / UC Diagnoses   Final diagnoses:  Gastroesophageal reflux disease, unspecified whether esophagitis present  Chest discomfort     Discharge Instructions      EKG negative for acute findings. Chest x-ray significant for chronic but enlarging mediastinal soft tissue.  Recommend further characterization with a chest CT with IV contrast. Lab work obtained, you will be notified via telephone if there are abnormalities.       ED Prescriptions   None    PDMP not reviewed this encounter.   Lennice Jon BROCKS, FNP 09/14/24 1336

## 2024-09-15 ENCOUNTER — Ambulatory Visit (HOSPITAL_COMMUNITY): Payer: Self-pay
